# Patient Record
Sex: Male | Born: 1935 | Race: White | Hispanic: No | Marital: Married | Smoking: Never smoker
Health system: Southern US, Community
[De-identification: ages and names within clinical notes are randomized; demographics above are authoritative.]

## PROBLEM LIST (undated history)

## (undated) ENCOUNTER — Emergency Department (HOSPITAL_COMMUNITY): Payer: Medicare Other

## (undated) DIAGNOSIS — I739 Peripheral vascular disease, unspecified: Secondary | ICD-10-CM

## (undated) DIAGNOSIS — E785 Hyperlipidemia, unspecified: Secondary | ICD-10-CM

## (undated) DIAGNOSIS — I779 Disorder of arteries and arterioles, unspecified: Secondary | ICD-10-CM

## (undated) DIAGNOSIS — I723 Aneurysm of iliac artery: Secondary | ICD-10-CM

## (undated) DIAGNOSIS — R55 Syncope and collapse: Secondary | ICD-10-CM

## (undated) DIAGNOSIS — Z951 Presence of aortocoronary bypass graft: Secondary | ICD-10-CM

## (undated) DIAGNOSIS — M549 Dorsalgia, unspecified: Secondary | ICD-10-CM

## (undated) DIAGNOSIS — G629 Polyneuropathy, unspecified: Secondary | ICD-10-CM

## (undated) DIAGNOSIS — G8929 Other chronic pain: Secondary | ICD-10-CM

## (undated) DIAGNOSIS — J189 Pneumonia, unspecified organism: Secondary | ICD-10-CM

## (undated) DIAGNOSIS — I2510 Atherosclerotic heart disease of native coronary artery without angina pectoris: Secondary | ICD-10-CM

## (undated) DIAGNOSIS — C801 Malignant (primary) neoplasm, unspecified: Secondary | ICD-10-CM

## (undated) DIAGNOSIS — C3490 Malignant neoplasm of unspecified part of unspecified bronchus or lung: Secondary | ICD-10-CM

## (undated) DIAGNOSIS — I10 Essential (primary) hypertension: Secondary | ICD-10-CM

## (undated) DIAGNOSIS — I4891 Unspecified atrial fibrillation: Principal | ICD-10-CM

## (undated) DIAGNOSIS — C140 Malignant neoplasm of pharynx, unspecified: Secondary | ICD-10-CM

## (undated) HISTORY — DX: Presence of aortocoronary bypass graft: Z95.1

## (undated) HISTORY — PX: CERVICAL DISCECTOMY: SHX98

## (undated) HISTORY — DX: Dorsalgia, unspecified: M54.9

## (undated) HISTORY — DX: Malignant neoplasm of pharynx, unspecified: C14.0

## (undated) HISTORY — PX: CAROTID ENDARTERECTOMY: SUR193

## (undated) HISTORY — DX: Peripheral vascular disease, unspecified: I73.9

## (undated) HISTORY — DX: Atherosclerotic heart disease of native coronary artery without angina pectoris: I25.10

## (undated) HISTORY — DX: Hyperlipidemia, unspecified: E78.5

## (undated) HISTORY — DX: Aneurysm of iliac artery: I72.3

## (undated) HISTORY — DX: Other chronic pain: G89.29

## (undated) HISTORY — DX: Pneumonia, unspecified organism: J18.9

## (undated) HISTORY — DX: Syncope and collapse: R55

## (undated) HISTORY — DX: Disorder of arteries and arterioles, unspecified: I77.9

## (undated) HISTORY — DX: Essential (primary) hypertension: I10

## (undated) HISTORY — PX: LUMBAR DISC SURGERY: SHX700

## (undated) HISTORY — DX: Polyneuropathy, unspecified: G62.9

## (undated) HISTORY — DX: Unspecified atrial fibrillation: I48.91

## (undated) HISTORY — DX: Malignant neoplasm of unspecified part of unspecified bronchus or lung: C34.90

## (undated) HISTORY — DX: Malignant (primary) neoplasm, unspecified: C80.1

---

## 1995-12-07 DIAGNOSIS — Z951 Presence of aortocoronary bypass graft: Secondary | ICD-10-CM

## 1995-12-07 HISTORY — DX: Presence of aortocoronary bypass graft: Z95.1

## 1995-12-07 HISTORY — PX: CORONARY ARTERY BYPASS GRAFT: SHX141

## 2000-09-08 ENCOUNTER — Ambulatory Visit (HOSPITAL_COMMUNITY): Admission: RE | Admit: 2000-09-08 | Discharge: 2000-09-08 | Payer: Self-pay | Admitting: Cardiovascular Disease

## 2000-09-08 ENCOUNTER — Encounter: Payer: Self-pay | Admitting: Cardiovascular Disease

## 2001-03-31 ENCOUNTER — Encounter: Payer: Self-pay | Admitting: Cardiovascular Disease

## 2001-03-31 ENCOUNTER — Ambulatory Visit (HOSPITAL_COMMUNITY): Admission: RE | Admit: 2001-03-31 | Discharge: 2001-03-31 | Payer: Self-pay | Admitting: Cardiovascular Disease

## 2001-05-19 ENCOUNTER — Encounter (INDEPENDENT_AMBULATORY_CARE_PROVIDER_SITE_OTHER): Payer: Self-pay | Admitting: *Deleted

## 2001-05-19 ENCOUNTER — Inpatient Hospital Stay (HOSPITAL_COMMUNITY): Admission: RE | Admit: 2001-05-19 | Discharge: 2001-05-20 | Payer: Self-pay

## 2001-07-24 ENCOUNTER — Other Ambulatory Visit: Admission: RE | Admit: 2001-07-24 | Discharge: 2001-07-24 | Payer: Self-pay | Admitting: Urology

## 2001-07-24 ENCOUNTER — Encounter (INDEPENDENT_AMBULATORY_CARE_PROVIDER_SITE_OTHER): Payer: Self-pay | Admitting: *Deleted

## 2002-04-30 ENCOUNTER — Encounter: Payer: Self-pay | Admitting: Otolaryngology

## 2002-04-30 ENCOUNTER — Encounter: Admission: RE | Admit: 2002-04-30 | Discharge: 2002-04-30 | Payer: Self-pay | Admitting: Otolaryngology

## 2002-05-01 ENCOUNTER — Ambulatory Visit (HOSPITAL_BASED_OUTPATIENT_CLINIC_OR_DEPARTMENT_OTHER): Admission: RE | Admit: 2002-05-01 | Discharge: 2002-05-01 | Payer: Self-pay | Admitting: Otolaryngology

## 2002-05-01 ENCOUNTER — Encounter (INDEPENDENT_AMBULATORY_CARE_PROVIDER_SITE_OTHER): Payer: Self-pay | Admitting: *Deleted

## 2002-05-07 ENCOUNTER — Ambulatory Visit: Admission: RE | Admit: 2002-05-07 | Discharge: 2002-08-05 | Payer: Self-pay | Admitting: Radiation Oncology

## 2002-12-27 ENCOUNTER — Ambulatory Visit: Admission: RE | Admit: 2002-12-27 | Discharge: 2002-12-27 | Payer: Self-pay | Admitting: Radiation Oncology

## 2003-09-26 ENCOUNTER — Ambulatory Visit: Admission: RE | Admit: 2003-09-26 | Discharge: 2003-09-26 | Payer: Self-pay | Admitting: Radiation Oncology

## 2003-11-25 ENCOUNTER — Ambulatory Visit (HOSPITAL_BASED_OUTPATIENT_CLINIC_OR_DEPARTMENT_OTHER): Admission: RE | Admit: 2003-11-25 | Discharge: 2003-11-25 | Payer: Self-pay | Admitting: Otolaryngology

## 2003-11-25 ENCOUNTER — Ambulatory Visit (HOSPITAL_COMMUNITY): Admission: RE | Admit: 2003-11-25 | Discharge: 2003-11-25 | Payer: Self-pay | Admitting: Otolaryngology

## 2003-11-25 ENCOUNTER — Encounter (INDEPENDENT_AMBULATORY_CARE_PROVIDER_SITE_OTHER): Payer: Self-pay | Admitting: *Deleted

## 2003-11-25 ENCOUNTER — Encounter: Admission: RE | Admit: 2003-11-25 | Discharge: 2003-11-25 | Payer: Self-pay | Admitting: Otolaryngology

## 2004-01-15 ENCOUNTER — Encounter: Admission: RE | Admit: 2004-01-15 | Discharge: 2004-01-15 | Payer: Self-pay | Admitting: Otolaryngology

## 2004-03-11 ENCOUNTER — Ambulatory Visit: Admission: RE | Admit: 2004-03-11 | Discharge: 2004-03-11 | Payer: Self-pay | Admitting: Radiation Oncology

## 2004-06-29 ENCOUNTER — Encounter: Admission: RE | Admit: 2004-06-29 | Discharge: 2004-06-29 | Payer: Self-pay | Admitting: Otolaryngology

## 2004-06-30 ENCOUNTER — Ambulatory Visit (HOSPITAL_BASED_OUTPATIENT_CLINIC_OR_DEPARTMENT_OTHER): Admission: RE | Admit: 2004-06-30 | Discharge: 2004-06-30 | Payer: Self-pay | Admitting: Otolaryngology

## 2004-06-30 ENCOUNTER — Ambulatory Visit (HOSPITAL_COMMUNITY): Admission: RE | Admit: 2004-06-30 | Discharge: 2004-06-30 | Payer: Self-pay | Admitting: Otolaryngology

## 2004-08-03 ENCOUNTER — Observation Stay (HOSPITAL_COMMUNITY): Admission: EM | Admit: 2004-08-03 | Discharge: 2004-08-04 | Payer: Self-pay | Admitting: Emergency Medicine

## 2004-08-12 ENCOUNTER — Ambulatory Visit: Admission: RE | Admit: 2004-08-12 | Discharge: 2004-08-12 | Payer: Self-pay | Admitting: Radiation Oncology

## 2004-11-30 ENCOUNTER — Emergency Department (HOSPITAL_COMMUNITY): Admission: EM | Admit: 2004-11-30 | Discharge: 2004-11-30 | Payer: Self-pay | Admitting: Emergency Medicine

## 2005-08-17 ENCOUNTER — Ambulatory Visit: Payer: Self-pay | Admitting: Gastroenterology

## 2005-11-09 ENCOUNTER — Ambulatory Visit (HOSPITAL_COMMUNITY): Admission: RE | Admit: 2005-11-09 | Discharge: 2005-11-09 | Payer: Self-pay | Admitting: Cardiovascular Disease

## 2006-02-15 ENCOUNTER — Emergency Department: Payer: Self-pay | Admitting: Emergency Medicine

## 2006-02-15 ENCOUNTER — Other Ambulatory Visit: Payer: Self-pay

## 2006-06-23 ENCOUNTER — Inpatient Hospital Stay (HOSPITAL_COMMUNITY): Admission: EM | Admit: 2006-06-23 | Discharge: 2006-06-28 | Payer: Self-pay | Admitting: Family Medicine

## 2006-06-24 ENCOUNTER — Encounter (INDEPENDENT_AMBULATORY_CARE_PROVIDER_SITE_OTHER): Payer: Self-pay | Admitting: *Deleted

## 2006-06-24 ENCOUNTER — Ambulatory Visit: Payer: Self-pay | Admitting: Internal Medicine

## 2008-02-21 ENCOUNTER — Encounter: Payer: Self-pay | Admitting: Internal Medicine

## 2008-03-06 ENCOUNTER — Encounter: Payer: Self-pay | Admitting: Internal Medicine

## 2008-08-14 ENCOUNTER — Ambulatory Visit: Payer: Self-pay | Admitting: Unknown Physician Specialty

## 2008-08-27 ENCOUNTER — Encounter: Payer: Self-pay | Admitting: Specialist

## 2008-09-05 ENCOUNTER — Encounter: Payer: Self-pay | Admitting: Specialist

## 2009-03-11 ENCOUNTER — Ambulatory Visit (HOSPITAL_BASED_OUTPATIENT_CLINIC_OR_DEPARTMENT_OTHER): Admission: RE | Admit: 2009-03-11 | Discharge: 2009-03-11 | Payer: Self-pay | Admitting: Otolaryngology

## 2009-03-11 ENCOUNTER — Encounter (INDEPENDENT_AMBULATORY_CARE_PROVIDER_SITE_OTHER): Payer: Self-pay | Admitting: Otolaryngology

## 2009-05-05 ENCOUNTER — Emergency Department (HOSPITAL_COMMUNITY): Admission: AC | Admit: 2009-05-05 | Discharge: 2009-05-05 | Payer: Self-pay

## 2009-09-01 ENCOUNTER — Ambulatory Visit: Payer: Self-pay | Admitting: Family Medicine

## 2009-11-17 ENCOUNTER — Emergency Department (HOSPITAL_COMMUNITY): Admission: EM | Admit: 2009-11-17 | Discharge: 2009-11-17 | Payer: Self-pay | Admitting: Emergency Medicine

## 2010-04-08 ENCOUNTER — Encounter: Admission: RE | Admit: 2010-04-08 | Discharge: 2010-04-08 | Payer: Self-pay | Admitting: Otolaryngology

## 2010-04-10 ENCOUNTER — Ambulatory Visit (HOSPITAL_BASED_OUTPATIENT_CLINIC_OR_DEPARTMENT_OTHER): Admission: RE | Admit: 2010-04-10 | Discharge: 2010-04-10 | Payer: Self-pay | Admitting: Otolaryngology

## 2010-10-06 ENCOUNTER — Ambulatory Visit (HOSPITAL_BASED_OUTPATIENT_CLINIC_OR_DEPARTMENT_OTHER): Admission: RE | Admit: 2010-10-06 | Discharge: 2010-10-06 | Payer: Self-pay | Admitting: Otolaryngology

## 2010-12-11 ENCOUNTER — Encounter
Admission: RE | Admit: 2010-12-11 | Discharge: 2010-12-11 | Payer: Self-pay | Source: Home / Self Care | Attending: Cardiovascular Disease | Admitting: Cardiovascular Disease

## 2010-12-24 ENCOUNTER — Ambulatory Visit (HOSPITAL_COMMUNITY)
Admission: RE | Admit: 2010-12-24 | Discharge: 2010-12-25 | Payer: Self-pay | Source: Home / Self Care | Attending: Ophthalmology | Admitting: Ophthalmology

## 2010-12-28 LAB — BASIC METABOLIC PANEL
BUN: 9 mg/dL (ref 6–23)
GFR calc non Af Amer: >60 mL/min (ref >60)
Glucose, Bld: 86 mg/dL (ref 70–99)
Potassium: 4.1 mEq/L (ref 3.5–5.1)

## 2010-12-28 LAB — CBC
HCT: 37.5 % — ABNORMAL LOW (ref 39.0–52.0)
MCHC: 32.8 g/dL (ref 30.0–36.0)
MCV: 91.2 fL (ref 78.0–100.0)
RDW: 13.1 % (ref 11.5–15.5)

## 2010-12-28 LAB — CARDIAC PANEL(CRET KIN+CKTOT+MB+TROPI)
CK, MB: 1.8 ng/mL (ref 0.3–4.0)
Relative Index: INVALID (ref 0.0–2.5)
Relative Index: INVALID (ref 0.0–2.5)
Troponin I: 0.02 ng/mL (ref 0.00–0.06)

## 2010-12-31 NOTE — Consult Note (Addendum)
Ward, Jon              ACCOUNT NO.:  [id number]  MEDICAL RECORD NO.:  [id number]          PATIENT TYPE:  OIB  LOCATION:  2038                         FACILITY:  [facility]  PHYSICIAN:  Thereasa Solo. Little, M.D. DATE OF BIRTH:  1929-09-04  DATE OF CONSULTATION:  12/24/2010 DATE OF DISCHARGE:                                CONSULTATION   PRIMARY CARE PHYSICIAN:  Julieanne Manson in [place].  REASON FOR CONSULTATION:  Tachycardia, rates up to 112 in the operating room and PACU.  HISTORY OF PRESENT ILLNESS:  A 77 year old male patient of Dr. Clayborne Dana with history of bypass grafting in 1997 x3 as well as peripheral vascular disease with history of left carotid endarterectomy and iliac stenting in the past.  The patient presented to the operating room today for PPV surgery on his eye with Dr. Ashley Royalty.  I was originally told by Anesthesia, he did have some PACs and brief episodes of tachycardia in the surgery and then he came to the PACU, was bradycardic and then had PACs and some slight SVT with a rate of 112.  On this, it looks to be multifocal atrial tach, self-terminated and short lived, but multiple short events.  At the time of evaluation, he was sinus brady of 56, blood pressure of 130/45.  FAMILY HISTORY:  Not significant to this.  SOCIAL HISTORY:  He is married.  No children.  Does not use tobacco.  ALLERGIES:  To PENICILLIN.  OTHER PAST MEDICAL HISTORY:  Recent abnormal lower extremity arterial Dopplers with plans for PV angiogram once his eyes surgery was resolved.  OTHER SURGERIES:  Palatal lesion consistent with carcinoma in situ and excision of palatal lesion in November 2011 and history of anterior vocal cord carcinoma with chemo in 1990 and wide excision in 2004.  The patient also has a history of left subclavian artery stenosis.  He has had lumbar diskectomy in 1996 and again in 76, treated hypertension.  HOME MEDICATIONS: 1. Finasteride 5 mg  one daily. 2. Benazepril 10 mg one daily. 3. Gabapentin 100 mg 2 capsules q.i.d. 4. Niacin SR 500 mg one daily. 5. Valium 5 mg 1 t.i.d. p.r.n. 6. Soma 350 mg t.i.d. p.r.n. 7. Lovastatin 40 mg 1 tablet q.p.m. 8. Multivitamins 1 daily. 9. Tramadol 50 mg 1 tablet q.6 h p.r.n. 10.Pletal 50 mg 1 daily. 11.He is not on any beta blockers.  REVIEW OF SYSTEMS:  Denies any chest pain.  PULMONARY:  Gets occasional shortness of breath.  GI:  No diarrhea, constipation, or melena.  NEURO: No lightheadedness, dizziness.  No syncope.  MUSCULOSKELETAL:  Negative.  PHYSICAL EXAMINATION:  VITAL SIGNS:  Blood pressure 138/73, pulse 76, respiratory rate is 18, temperature 98, oxygen saturation room air 99%. GENERAL:  Alert, oriented white male, right eye patch in place. HEENT:  Normocephalic. LUNGS:  Clear without rales, rhonchi, or wheezes. HEART:  Regular rate and rhythm without obvious murmur. ABDOMEN:  Soft, nontender.  Positive bowel sounds. LOWER EXTREMITIES:  Without edema.  Pedal pulses weak. NEUROLOGIC:  Alert and oriented x3.EKG:  Sinus rhythm to sinus bradycardia, no EKG changes, frequent PACs on some the EKGs  IMPRESSION: 1. Supraventricular tachycardia, possible multifocal atrial     tachycardia, the heart rate never greater than 112. 2. Premature atrial contractions. 3. Sinus bradycardia. 4. Pars plana vitrectomy surgery. 5. Coronary disease with history of bypass grafting in 1997. 6. Peripheral vascular disease with need for pulmonary vein angiogram     of his lower extremities in the future.  PLAN:  Cardiac enzymes; CK 67, MB 1.8, troponin-I 0.01.  We have given the patient an extra 250 bolus of normal saline here in the recovery room.  Electrolytes:  Sodium 139, potassium 4.1, chloride 106, CO2 27, glucose 86, BUN 9, creatinine 0.86, and calcium 8.5.  Hemoglobin 12.3, hematocrit 37.5, WBC 11, and platelets 171.  Chest x-ray on January 6, COPD, stable cardiomegaly.  No active  lung disease.  Dr. Clarene Duke also discussed this with the patient's wife.  We will follow up during hospitalization.  He will be placed in a telemetry bed.  His last stress test was July 2010, which revealed diaphragmatic attenuation with small area of inferior scar without ischemia.  He also had a 2-D echo, EF was 65%.     [name], N.P.   ______________________________ Thereasa Solo Little, M.D.    LRI/MEDQ  D:  12/24/2010  T:  12/25/2010  Job:  [id number]  cc:   Nanetta Batty, M.D.  Electronically Signed by Nada Boozer N.P. on 12/25/2010 07:10:07 PM Electronically Signed by Julieanne Manson M.D. on 12/31/2010 04:01:29 PM

## 2011-01-02 NOTE — Op Note (Signed)
  NAMEBOYD, BUFFALO              ACCOUNT NO.:  [id number]  MEDICAL RECORD NO.:  [id number]          PATIENT TYPE:  OIB  LOCATION:  2038                         FACILITY:  [facility]  PHYSICIAN:  Beulah Gandy. Ashley Royalty, M.D. DATE OF BIRTH:  December 12, 1928  DATE OF PROCEDURE:  12/24/2010 DATE OF DISCHARGE:                              OPERATIVE REPORT   ADMISSION DIAGNOSES:  Preretinal fibrosis, right eye.  PROCEDURES:  Pars plana vitrectomy, retinal photocoagulation, membrane peel and gas fluid exchange in the right eye.  SURGEON:  Beulah Gandy. Ashley Royalty, MD  ASSISTANT:  Rosalie Doctor, MA  ANESTHESIA:  General.  DETAILS:  Usual prep and drape, a 25-gauge trocar was placed at 8 and 10 o'clock.  A 20-gauge opening was made at 2 o'clock in the pars plana with a three-layered wound.  Contact lens ring anchored into place at 6 and 12 o'clock.  Provisc or Goniosol placed on the cornea and the flat contact lens was placed.  Pars plana vitrectomy was begun just behind the pseudophakos.  Dense membranes were encountered.  There were membranes attached to the disk.  They were peeled from the disk and peeled across the macular region with the vitreous cutter.  Once these areas were elevated, there was a tight adherent silver membrane on the macula as well.  This was engaged with the 20-gauge MVR instrument and then teased across the macular region with the silicone tip suction line, diamond dusted membrane scraper and 20-gauge MVR.  The membrane peeled all the way across the macula and over to the disk.  It was removed with the vitrectomy cutter.  The vitrectomy was carried out into the far periphery where additional membranes were encountered and removed.  The indirect ophthalmoscope laser was moved into place, 725 burns placed around the retinal periphery with the power of 200 milliwatts, 1000 microns each and 0.1 seconds.  Each treatments were made around weak areas in the retina.  A 30% gas fluid  exchange was performed.  The instruments were removed from the eye and the trocars were removed.  A 9-0 nylon was used to close the sclerotomy site at 2 o'clock.  Wet-field cautery, closure of the conjunctiva.  Polymyxin and gentamicin were irrigated into [place] space.  Atropine solution was applied, Decadron 10 mg was injected into the lower subconjunctival space.  Marcaine was injected around the globe for postop pain.  Closing pressure was 10 with a Barraquer tonometer.  Complications none. Duration 1 hour.  The patient was awakened and taken to recovery in satisfactory condition.     Beulah Gandy. Ashley Royalty, M.D.    JDM/MEDQ  D:  12/24/2010  T:  12/25/2010  Job:  [id number]  Electronically Signed by Alan Mulder M.D. on 01/02/2011 09:26:17 AM

## 2011-02-16 LAB — POCT HEMOGLOBIN-HEMACUE: Hemoglobin: 13 g/dL (ref 13.0–17.0)

## 2011-02-17 LAB — BASIC METABOLIC PANEL
BUN: 8 mg/dL (ref 6–23)
CO2: 27 mEq/L (ref 19–32)
Chloride: 107 mEq/L (ref 96–112)
Creatinine, Ser: 0.68 mg/dL (ref 0.4–1.5)
Glucose, Bld: 119 mg/dL — ABNORMAL HIGH (ref 70–99)

## 2011-02-23 LAB — BASIC METABOLIC PANEL
BUN: 8 mg/dL (ref 6–23)
CO2: 28 mEq/L (ref 19–32)
Calcium: 9.1 mg/dL (ref 8.4–10.5)
Creatinine, Ser: 0.69 mg/dL (ref 0.4–1.5)
GFR calc Af Amer: >60 mL/min (ref >60)
Glucose, Bld: 93 mg/dL (ref 70–99)

## 2011-02-23 LAB — POCT HEMOGLOBIN-HEMACUE: Hemoglobin: 12.9 g/dL — ABNORMAL LOW (ref 13.0–17.0)

## 2011-03-09 LAB — BASIC METABOLIC PANEL
BUN: 10 mg/dL (ref 6–23)
Calcium: 8.5 mg/dL (ref 8.4–10.5)
Creatinine, Ser: 0.71 mg/dL (ref 0.4–1.5)
GFR calc non Af Amer: >60 mL/min (ref >60)
Glucose, Bld: 86 mg/dL (ref 70–99)
Potassium: 4.4 mEq/L (ref 3.5–5.1)

## 2011-03-09 LAB — CBC
HCT: 36.8 % — ABNORMAL LOW (ref 39.0–52.0)
Platelets: 174 10*3/uL (ref 150–400)
RDW: 13.6 % (ref 11.5–15.5)
WBC: 10.1 10*3/uL (ref 4.0–10.5)

## 2011-03-09 LAB — DIFFERENTIAL
Basophils Absolute: 0 10*3/uL (ref 0.0–0.1)
Eosinophils Relative: 2 % (ref 0–5)
Lymphocytes Relative: 20 % (ref 12–46)
Neutrophils Relative %: 68 % (ref 43–77)

## 2011-03-16 LAB — CBC
HCT: 34.8 % — ABNORMAL LOW (ref 39.0–52.0)
Hemoglobin: 12.1 g/dL — ABNORMAL LOW (ref 13.0–17.0)
MCHC: 34.7 g/dL (ref 30.0–36.0)
MCV: 94.3 fL (ref 78.0–100.0)
Platelets: 174 K/uL (ref 150–400)
RBC: 3.69 MIL/uL — ABNORMAL LOW (ref 4.22–5.81)
RDW: 12.9 % (ref 11.5–15.5)
WBC: 8.4 K/uL (ref 4.0–10.5)

## 2011-03-16 LAB — BASIC METABOLIC PANEL WITH GFR
BUN: 14 mg/dL (ref 6–23)
CO2: 24 meq/L (ref 19–32)
Calcium: 8.2 mg/dL — ABNORMAL LOW (ref 8.4–10.5)
Chloride: 107 meq/L (ref 96–112)
Creatinine, Ser: 0.85 mg/dL (ref 0.4–1.5)
GFR calc non Af Amer: >60 mL/min
Glucose, Bld: 133 mg/dL — ABNORMAL HIGH (ref 70–99)
Potassium: 3.8 meq/L (ref 3.5–5.1)
Sodium: 137 meq/L (ref 135–145)

## 2011-03-16 LAB — TYPE AND SCREEN
ABO/RH(D): A POS
Antibody Screen: NEGATIVE

## 2011-03-16 LAB — ABO/RH: ABO/RH(D): A POS

## 2011-03-16 LAB — LACTIC ACID, PLASMA: Lactic Acid, Venous: 1.5 mmol/L (ref 0.5–2.2)

## 2011-03-16 LAB — APTT: aPTT: 31 s (ref 24–37)

## 2011-03-16 LAB — PROTIME-INR
INR: 1.2 (ref 0.00–1.49)
Prothrombin Time: 15.1 s (ref 11.6–15.2)

## 2011-04-20 NOTE — Op Note (Signed)
NAMEJALAN, [name]              ACCOUNT NO.:  [id number]   MEDICAL RECORD NO.:  [id number]          PATIENT TYPE:  AMB   LOCATION:  DSC                          FACILITY:  [facility]   PHYSICIAN:  Christopher E. 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   OPERATION:  CO2 laser excision of the uvula and the laser ablation of  soft palate and mucosa with CO2 laser.   SURGEON:  Kristine Garbe. Ezzard Standing, MD   ANESTHESIA:  Local, Xylocaine with epinephrine along with Cetacaine  topical spray.   COMPLICATIONS:  None.   CLINICAL NOTE:  [name] is a 77 year old gentleman who has had  previous history of hypopharyngeal as well as laryngeal cancer.  He is  status post treatment with radiation therapy.  Over the last 3 months,  he has had some significant erythroplasia and leukoplakia involving the  uvula as well as mucosa on the oral side of the soft palate.  This has  not responded to antibiotic or oral therapy.  Findings are consistent  with premalignant or malignant type changes.  He is taken to the  operating room at this time for excision of the uvula and CO2 laser  ablation of the erythroplasia on the soft palate.   DESCRIPTION OF PROCEDURE:  The patient was brought to the operating  room.  He had a topical Cetacaine spray for topical anesthetic, and the  uvula and soft palate were injected with 2.5 mL of Xylocaine with  epinephrine for local anesthetic.  Using the CO2 laser, set at 8 on  continuous, the uvula was excised and sent to pathology.  There was some  additional erythroplasia of the soft palate mucosa more superiorly and  this was ablated with defocused CO2 laser.  This completed the  procedure.  There  was minimal bleeding.  And the patient was  subsequently discharged home on diet as tolerated.  He is given Tylenol  and Vicodin p.r.n. pain and Z-Pak for 5 days. We will have him follow up  in my office in 10-14 days to review pathology.           ______________________________  Kristine Garbe Ezzard Standing, M.D.     CEN/MEDQ  D:  03/11/2009  T:  03/12/2009  Job:  [id number]

## 2011-04-23 NOTE — Consult Note (Signed)
[name], [name]              ACCOUNT NO.:  [id number]   MEDICAL RECORD NO.:  [id number]          PATIENT TYPE:  INP   LOCATION:  3713                         FACILITY:  [facility]   PHYSICIAN:  Cristi Loron, M.D.DATE OF BIRTH:  [birth year]   DATE OF CONSULTATION:  06/24/2006  DATE OF DISCHARGE:                                   CONSULTATION   CHIEF COMPLAINT:  Near syncope.   HISTORY OF PRESENT ILLNESS:  The patient is a 77 year old white male who  presents with a jumbled and complicated history.  He states that his most  recent problems began about two months ago when he had the flu.  He was  seen at [facility] where he was treated and released.  At that point he thought I was going to die.  Eventually these symptoms  resolved.   More recently, the patient states he has been feeling bad.  It is  difficult to pin him down on what exactly is bothering him.  It sounds like  he has had a couple of near syncopal episodes.  A couple of days ago he was  at the grocery store and he felt as if he was going to pass out.  He felt  weak in general and had to be helped to his seat.  He is brought to his  primary doctor, Dr. Julieanne Manson, who, according to the patient did some  blood tests and he was then sent for GI doctor for, from what I gather, a  possible GI bleed.  They adjusted his medications some.  More recently as  above, the patient had his near syncopal episode and he was seen by urgent  care and then was referred from Urgent Care to [facility] Emergency  Department where he was admitted by the teaching service.  Presently the  patient has multiple diffuse complaints but the main seems to be these near  syncopal episodes.  His fingers are numb.  His hands feel weak.  His feet  are numb.  He feels his fingers are getting progressively more numb over the  years.  The patient denies sudden onset headaches.  Did not notice any  palpitations with some  questionable history of chest pain.   The patient was submitted by teaching service and workup has included a  brain MRI, MRA as well as cervical and lumbar MRI when the spine MRIs were  abnormal and subsequent consultation was requested.   PAST MEDICAL HISTORY:  Positive for coronary artery disease, vocal cord  cancer diagnosed x2 in 1999 and 2004, colon polyps, peripheral  cerebrovascular disease, hyperlipidemia, benign prostatic hypertrophy with  elevated PSA, hypertension, cataracts, peptic ulcer disease, cervical  ruptured disk, lumbar ruptured disk, ulna neuropathy, hypercholesterolemia,  peripheral neuropathy, chronic lumbago.   PAST SURGICAL HISTORY:  Status post lumbar discectomy in 1966 and 1974 done  in [place], anterior cervical discectomy and fusion 1977 in [place],  ulnar nerve transposition, coronary artery bypass graft x3, left carotid  endarterectomy, right iliac artery angioplasty stenting, resection section  common iliac artery, cataract surgery, removal of vocal  cord cancer with  subsequent radiation.   PRESENT MEDICATIONS:  1.  Lotensin 10 mg p.o. daily.  2.  Cardura 4 mg p.o. daily.  3.  Zocor 20 mg daily.  4.  Plendil 50 mg p.o. b.i.d.  5.  Neurontin 200 mg p.o. b.i.d.  6.  Prozac 20 mg p.o. daily.  7.  Valium 5 mg p.r.n.  8.  Soma p.r.n.  9.  Lovenox 40 mg subcutaneous daily.  10. Proscar 5 mg p.o. daily.  11. Protonix 40 mg p.o. daily.  12. Phenergan p.r.n.  13. Dulcolax p.r.n.  14. Darvocet p.r.n.  15. Tylenol p.r.n.   DRUG ALLERGIES:  PENICILLIN CAUSES ANAPHYLACTIC REACTION.   FAMILY MEDICAL HISTORY:  Noncontributory.   SOCIAL HISTORY:  The patient is on his second marriage.  His first wife  died.  He is a retired Art therapist.  He has been disabled since a  Workmen's Comp injury in 1974.  He was smoking prior to that he had a three  pack per day 60 year history.  He quit drinking alcohol in 1997, heavy  drinking history, by  report.  Denies other drug use.   REVIEW OF SYSTEMS:  Negative except as above.   PHYSICAL EXAMINATION:  GENERAL:  A pleasant 77 year old white male in no  apparent distress.  HEENT:  Normocephalic, atraumatic.  Pupils are equal and round, react to  light.  Extraocular muscles are intact.  Oropharynx benign.  NECK:  Supple without masses, deformities, tracheal deviation, jugular  venous distention.  He has a limited cervical range of motion.  Spurling  testing is negative bilaterally, llhermittes was not present.  The patient  has a well-healed left carotid endarterectomy incision.  I really cannot  make out his anterior cervical discectomy incision.  Thorax is symmetric.  LUNGS:  Clear to auscultation.  HEART:  Regular rate and rhythm.  ABDOMEN:  Soft, nontender.  EXTREMITIES:  The patient has bilateral foot drops.  He has a well-healed  right ulnar nerve incision.  BACK:  The patient has a well-healed lumbar incision.  No deformities, point  tenderness.  Testing is negative bilaterally.  NEUROLOGIC EXAM:  The patient is alert and oriented x3.  Cranial nerves II  through XII were examined bilaterally.  Vision and hearing grossly normal  bilaterally except for some presbyopia and presbycusis which are age  appropriate.  Motor strength is difficult to assess as, at times, the  patient gives away but appears to be grossly normal his deltoid, biceps,  triceps, psoas, quadriceps.  Patient has weakness in his right hand grip at  approximately 4+/5.  The patient has bilateral foot drops (i.e., zero to  five).  His gastrocnemius strength is 4-/5 bilaterally.  The patient has a  strange tremor upon muscle strength testing, possibly non physiologic.  The  patient's deep tendon reflexes are 2/4.  His bilateral biceps, triceps 2+ to  3/4 in bilateral quadriceps and right gastrocnemius.  His left gastrocnemius reflex is 0/5.  The patient has three to four beat non-sustained ankle  clonus on the  right.  He has equivocal responses bilaterally.  Cerebellar  function is intact to rapid alternating movements and finger-to-nose  bilaterally.   I have reviewed the patient's cervical MRI performed without contrast June 23, 2006 at [facility].  Demonstrates the patient has a good  decompression effusion at C5/6 without residual stenosis.  The patient has  multifactorial spinal stenosis at C4/5 and, to a lesser extent,  C6/7.  On  C3/4 has some mild spinal stenosis and moderate bilateral neural foraminal  stenosis.  C4/5 has severe right neuro foraminal stenosis and mild left  neuro foraminal stenosis and moderate central spinal stenosis.  As above  C5/6 looks good.  C6/7 has a moderate central spondylosis versus herniated  disk and some relatively mild spinal stenosis.  C7/T1 looks fairly okay.   The patient's lumbar MRI performed with and without contrast June 23, 2006.  The patient had an L5 laminectomy.  He has degenerative disk disease at  L3/4, L4/5, and L5/S1 with Modic-type changes L3/4.  On axial film of L1/2  has some mild multifactorial spinal stenosis largely secondary to a  ligamentum flavum hypertrophy.  L3/4 and L4/5 have right greater than left  lateral recess stenosis secondary to some ligamentum flavum facet  hypertrophy.  L4/5 has a bilateral recess stenosis.  L5/S1 looks fairly  okay, being status post a laminectomy at that level.   Also patient's brain MRI.  MRI performed at [facility] June 23, 2006.  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  The MRI demonstrates he had a left  carotid endarterectomy.  The patient has bilateral vertebral artery stenosis  and right subclavian stenosis.  His anterior cranial vessels look fairly  okay.   ASSESSMENT AND PLAN:  1.  Near-syncopal episodes.  I have discussed this with the patient and his      wife.  A possible cause of this could be vertebral  artery insufficiency      although the patient has multiple medical problems to consider as well.      I think perhaps a neurology consult will help [place] with this.  I leave      this up to the medical team.  2.  Cervical stenosis.  I discussed this with him as well.  I told him he      has a moderate amount of stenosis above where he had his prior surgery      (i.e., at C4/5).  I explained that this could cause some numbness in his      hands and weakness (i.e., central cord syndrome).  However, this does      not seem to be an acute or pressing problem and given all of his medical      problems, I think there is no need to do surgery any time soon.  Would      recommend that he follow up with me in the office and we can discuss      this further at that time.  3.  Lumbar degenerative disk disease and stenosis.  This is a chronic      problem and I do not think he will need surgery on this. 4.  Multiple medical problems noted.      Cristi Loron, M.D.  Electronically Signed     JDJ/MEDQ  D:  06/24/2006  T:  06/24/2006  Job:  [id number]   cc:   Layne Benton, M.D.  [address] [place], [place] [address]   Julieanne Manson  Fax: [phone]

## 2011-04-23 NOTE — Op Note (Signed)
NAME:  [name], [name]                        ACCOUNT NO.:  [id number]   MEDICAL RECORD NO.:  [id number]                   PATIENT TYPE:  AMB   LOCATION:  DSC                                  FACILITY:  [facility]   PHYSICIAN:  Kristine Garbe. Ezzard Standing, M.D.         DATE OF BIRTH:  08/17/1929   DATE OF PROCEDURE:  11/25/2003  DATE OF DISCHARGE:                                 OPERATIVE REPORT   PREOPERATIVE DIAGNOSIS:  Hypopharyngeal lesion, questionable  neoplasia.   POSTOPERATIVE DIAGNOSIS:  Hypopharyngeal lesion, questionable neoplasia (1 x  1.5 cm exophytic mucosal lesion).   OPERATION:  Direct laryngoscopy, excisional biopsy of left posterior  hypopharyngeal lesion.   SURGEON:  Kristine Garbe. Ezzard Standing, M.D.   ANESTHESIA:  General endotracheal anesthesia.   COMPLICATIONS:  None.   INDICATIONS FOR PROCEDURE:  [name] is a 77 year old gentleman  who  has had a previous  history of  vocal cord cancer, status post  radiation  therapy. On routine follow up in the office the patient was noted to have a  hypopharyngeal lesion  on the posterior  pharyngeal wall which is new. He is  taken to the operating room at this time for direct laryngoscopy and biopsy.   DESCRIPTION OF PROCEDURE:  After adequate endotracheal anesthesia, first  direct laryngoscopy was performed. The tonsil regions appeared  benign  bilaterally. The base of tongue and vallecula area was clear. The epiglottis  appeared normal. On evaluation of the  vocal cords, the  vocal cords  appeared clear bilaterally. The anterior commissure was clear. Photos were  obtained.   On examination  of the hypopharynx, the patient had an elongated 1  x 1.5 cm  papillomatous appearing lesion on the left posterior hypopharyngeal wall  just above the left arytenoid. This was able to be easily visualized using  the Crowe-Davis mouth gag. With the lesion under direct visualization, it  was excised with scissors and  cautery and the  excision site was left open.   Hemostasis was achieved with adrenaline soaked pledgets and cautery. A  single stitch was used to mark the 1 o'clock position  on the excision site.  The lesion was grossly excised with 4 to 5-mm margins surrounding it. The  specimen was sent to pathology.  After obtaining adequate hemostasis, further direct laryngoscopy of the  piriform sinuses was clear bilaterally. The patient was awakened from  anesthesia and transferred to the recovery room postoperatively doing well.   DISPOSITION:  The patient is discharged to home later this morning on Keflex  500 b.i.d. for 1 week, Tylenol  and Tylenol #3 1 to  2 tablets q.4h. p.r.n.  pain. I will have him follow up in my office in 3 days for recheck.  Kristine Garbe. Ezzard Standing, M.D.    CEN/MEDQ  D:  11/25/2003  T:  11/25/2003  Job:  [id number]   cc:   Julieanne Manson  [address]  [place]  [place] [address]  Fax: [phone]   Maryln Gottron, M.D.  [address] [facility] [facility]  [place]  [place] [address]  Fax: [phone]

## 2011-04-23 NOTE — Cardiovascular Report (Signed)
NAMEARISTON, GRANDISON NO.:  [id number]   MEDICAL RECORD NO.:  [id number]          PATIENT TYPE:  INP   LOCATION:  3713                         FACILITY:  [facility]   PHYSICIAN:  Nanetta Batty, M.D.   DATE OF BIRTH:  02-14-1929   DATE OF PROCEDURE:  06/27/2006  DATE OF DISCHARGE:                              CARDIAC CATHETERIZATION   PROCEDURE:   HISTORY:  Venning is a 77 year old white male with a history of CABG x3 in  1997, a __________.  He has had left carotid endarterectomy and iliac  stenting in the past.  He was admitted 07/19 with dizziness.  An MRA showed  the possibility of right subclavian and bilateral vertebral disease with a  parietal infarct of unknown age.  He has had no further dizziness here.  He  presents now for subclavian and vertebral angiography to rule out posterior  circulation/subclavian steal physiology.   PROCEDURE DESCRIPTION:  The patient was brought to the second floor [place] of  PV angiographic suite in the postabsorptive state.  He was premedicated with  p.o. Valium. He was prepped and draped in the usual sterile fashion. Then 1%  Xylocaine was used for local anesthesia. A 5-French sheath was inserted into  the right femoral artery using standard Seldinger technique.  A 5-French  pigtail catheter was used for arch angiography.  A 5-French CV catheter was  used for selective right subclavian, right-and-left vertebral angiography.  Hand shot was performed via the __________  sheath to visualize the right  external and common iliac artery stents.  Visipaque dye was used for the  entirety of the case.  The aortic pressure were monitored throughout the  case.   ANGIOGRAPHIC RESULTS:  1.  Right subclavian widely patent.  2.  Right vertebral widely patent with an intact posterior circulation      anatomy.  3.  Left vertebral widely patent.  There is no evidence of ostial      involvement.  4.  Right common and external iliac artery  stents widely patent.   IMPRESSION:  Mr. Silvestro has widely patent subclavian vertebral arteries.  I  do not think that he has posterior circulation symptoms, or anatomy which  would contribute to this.  I believe that his MRA was false positive.  Sheath was removed; and pressure was placed on the groin for hemostasis.  The patient left the lab in stable condition.      Nanetta Batty, M.D.  Electronically Signed     JB/MEDQ  D:  06/27/2006  T:  06/27/2006  Job:  [id number]   cc:   Peripheral Vascular Lab   [facility]

## 2011-04-23 NOTE — Discharge Summary (Signed)
[name], [name]              ACCOUNT NO.:  [id number]   MEDICAL RECORD NO.:  [id number]          PATIENT TYPE:  AMB   LOCATION:  SDS                          FACILITY:  [facility]   PHYSICIAN:  Nanetta Batty, M.D.   DATE OF BIRTH:  Feb 06, 1929   DATE OF ADMISSION:  11/09/2005  DATE OF DISCHARGE:  11/09/2005                                 DISCHARGE SUMMARY   DISCHARGE DIAGNOSES:  1.  Peripheral vascular disease with failed attempt at peripheral angiogram      and peripheral intervention this admission.  2.  Known peripheral vascular disease with in-stent restenosis of the right      iliac stent.  3.  Coronary artery bypass grafting in 1997.  4.  Dyslipidemia.  5.  Treated hypertension.  6.  Chronic pain and disability secondary to nerve damage after back surgery      in the past.   HOSPITAL COURSE:  [name] is a 77 year old male followed by Dr. Allyson Sabal  with a prior history of coronary artery bypass grafting.  He also has  widespread peripheral vascular disease.  Peripheral Dopplers done as an  outpatient indicated in-stent restenosis to his previously placed right  iliac stent.  The patient was admitted for elective intervention.  He did  have a Cardiolite study in September 2006 that showed scar in the RCA  distribution but no ischemia.   PAST MEDICAL HISTORY:  As noted above.  He also has a history of laryngeal  cancer and had radiation therapy in 2003.   MEDICATIONS PRIOR TO ADMISSION:  1.  Prozac 20 mg a day.  2.  Valium 5 mg 3 times a day p.r.n.  3.  Darvocet p.r.n.  4.  Neurontin 200 mg 4 times a day.  5.  Soma 350 mg daily.  6.  Aspirin daily.  7.  Lovastatin 40 mg at bedtime.  8.  Niacin 500 mg 2 tablets at bedtime.  9.  Doxazosin 1 mg at bedtime.  10. Benazepril 10 mg a day.   ALLERGIES:  PENICILLIN.   SOCIAL HISTORY:  He is married.  [facility]   FAMILY HISTORY:  Unremarkable for coronary disease.   REVIEW OF  SYSTEMS:  Unremarkable except as noted above.  He has had remote  GI bleeding in the 1970s.  History of history of thyroid disease or prior  stroke.   PHYSICAL EXAMINATION:  VITAL SIGNS:  Blood pressure 128/68, pulse 83,  temperature 97.  GENERAL:  He is a well-developed, overweight male in no acute distress.  HEENT: Normocephalic.  Extraocular movements are intact.  Sclerae  nonicteric.  NECK:  Without JVD.  CHEST: Clear to auscultation and percussion.  CARDIAC:  Regular rate and rhythm with S1 and S2.  No murmur or gallop  noted.  ABDOMEN:  Tender.  Bowel sounds are present.  EXTREMITIES:  Reveal bilateral leg braces without edema. Distal pulses are  diminished.  NEUROLOGIC:  Grossly intact.  He is awake, alert, oriented, and cooperative.   IMPRESSION:  1.  Abnormal lower extremity arterial  Doppler study suggesting in-stent      restenosis.  2.  Widespread peripheral vascular disease with previous interventions.  3.  Coronary artery bypass grafting with recent negative Cardiolite.  4.  Chronic back pain and debilitation after back surgery.  5.  Treated hyperlipidemia.  6.  Treated hypertension.   PLAN:  The patient is admitted by Dr. Allyson Sabal for elective angiogram.      [name], P.A.      Nanetta Batty, M.D.  Electronically Signed    LKK/MEDQ  D:  12/22/2005  T:  12/22/2005  Job:  [id number]

## 2011-04-23 NOTE — Cardiovascular Report (Signed)
NAMEJADIN, CREQUE NO.:  [id number]   MEDICAL RECORD NO.:  [id number]          PATIENT TYPE:  AMB   LOCATION:  SDS                          FACILITY:  [facility]   PHYSICIAN:  Nanetta Batty, M.D.   DATE OF BIRTH:  06/12/1929   DATE OF PROCEDURE:  11/09/2005  DATE OF DISCHARGE:                              CARDIAC CATHETERIZATION   Mr. Scheel is a 77 year old gentleman with a history of coronary artery  bypass grafting in 1977.  He has had stenting of his right common and  external iliac artery as well as left carotid endarterectomy.  He has  moderate left subclavian artery stenosis.  He does complain of right lower  extremity claudication and Dopplers suggest potential for restenosis in the  right external iliac artery.  He also has high grade disease in the mid  right SFA.  He presents now for angiography and potential intervention for  treatment of symptoms of functional limiting claudication.   DESCRIPTION OF PROCEDURE:  The patient was brought to the sixth floor [facility] Angiographic Suite in the postabsorptive state.  He  was premedicated with p.o. Valium.  His right groin was prepped and shaved  in the usual sterile fashion.  1% Xylocaine was used for local anesthesia.  A 5 French sheath was inserted into the right femoral artery using standard  Seldinger technique.  A 5 French tennis racquet catheter was used for distal  abdominal aortography with bilateral femoral run off.  Visipaque dye was  used for the entirety of the case.  Retrograde aortic pressures were  monitored during the case.   ANGIOGRAPHIC RESULTS:  1.  Abdominal aorta:      1.  Renal arteries patent.      2.  Infrarenal abdominal aorta - moderate atherosclerotic changes.  2.  Left lower extremity:      1.  Diffuse nonobstructive atherosclerosis in the left common and          external iliac artery.  There was 80-90% long segmental disease in          the mid left  SFA.  The inferior popliteal vasculature was not          adequately assessed.  3.  Right lower extremity:      1.  Patent right common and external iliac artery stent.      2.  Long 80-90% segmental stenosis of the proximal and mid portion of          the right SFA with two vessel run off.  The anterior tibial was          occluded.   Contralateral access was attempted using a cross over catheter, Indole and  right Judkins.  I was never able to traverse the proximal stent and access  the infrainguinal vasculature to perform percutaneous revascularization of  the right SFA.  This was ultimately abandoned.   IMPRESSION:  Patent right external and common iliac stent with long, diffuse  disease in the SFAs bilaterally.  I believe the patient will require  continued medical therapy.  The ACT was measured and the sheaths removed.  Pressure was held on the  groin to achieve hemostasis.  He left the lab in stable condition.  He will  be hydrated and discharged later today as an outpatient and I will see him  back in two weeks for follow up.      Nanetta Batty, M.D.  Electronically Signed     JB/MEDQ  D:  11/09/2005  T:  11/09/2005  Job:  [id number]   cc:   Julieanne Manson  Fax: [phone]

## 2011-04-23 NOTE — H&P (Signed)
[name], [name]              ACCOUNT NO.:  [id number]   MEDICAL RECORD NO.:  [id number]          PATIENT TYPE:  AMB   LOCATION:  SDS                          FACILITY:  [facility]   PHYSICIAN:  Nanetta Batty, M.D.   DATE OF BIRTH:  1929-04-03   DATE OF ADMISSION:  11/09/2005  DATE OF DISCHARGE:  11/09/2005                                HISTORY & PHYSICAL   HISTORY OF PRESENT ILLNESS:  Mr. Chacko is a 77 year old male followed by  Dr. Allyson Sabal with prior bypass grafting in 1997. He had a Cardiolite in  December 2004 which showed no ischemia with good LV function.  He has had  peripheral vascular disease with prior right iliac artery stenting.  He has  also had apparently left common iliac artery done in December 2005. He was  seen by Dr. Allyson Sabal in September 2006. Dopplers and followup Cardiolite were  ordered.  His Dopplers did suggest in-stent restenosis of the right iliac  stent.  He is admitted now for elective intervention.   PAST MEDICAL HISTORY:  Remarkable for multiple back operations with  bilateral foot drop.  He wears braces on both legs.  He is on chronic pain  medications.  He has treated hyperlipidemia.   HOME MEDICATIONS:  1.  Prozac 20 mg a day.  2.  Valium 5 mg 3 times a day.  3.  Darvocet 4 tablets a day.  4.  OxyContin 4 mg a day.  5.  Soma 3 times a day.  6.  Aspirin 325 mg every other day.  7.  Lovastatin 40 mg at bedtime.  8.  Niacin at bedtime.  9.  Doxazosin 2 mg 1/2 tablet every day.  10. Benazepril 10 mg a day.   ALLERGIES:  Reported allergy to PENICILLIN.   SOCIAL HISTORY:  He is married.  He has no children.  He currently does not  smoke.   FAMILY HISTORY:  Unremarkable.   REVIEW OF SYSTEMS:  Essentially unremarkable except as above.  Cardiolite  August 18, 2005, showed scar in the RCA territory without ischemia.   PHYSICAL EXAMINATION:  VITAL SIGNS:  Blood pressure 128/68, pulse 83,  temperature 97, weight 210.  GENERAL:  He is a  well-developed, well-nourished male in no acute distress.  HEENT: Normocephalic.  NECK:  Without JVD or thyromegaly.  CHEST: Clear to auscultation and percussion.  CARDIAC:  Regular rate and rhythm with S1 and S2.  EXTREMITIES:  Bilateral leg braces.  Trace lower extremity edema.  NEUROLOGIC:  Exam is grossly intact.   IMPRESSION:  1.  Peripheral vascular disease with recent Dopplers suggesting right iliac      artery stenosis.  2.  Peripheral vascular disease with prior right iliac stent placement and      history of left common iliac artery stenting.  3.  Coronary disease, coronary artery bypass grafting in 1997 with recent      negative Cardiolite.  4.  Chronic back pain and dysmotility.  5.  Dyslipidemia.   DISPOSITION:  The patient will be admitted for outpatient elective  peripheral angiogram.  Abelino Derrick, P.A.      Nanetta Batty, M.D.  Electronically Signed    LKK/MEDQ  D:  01/03/2006  T:  01/03/2006  Job:  [id number]

## 2011-04-23 NOTE — Procedures (Signed)
[place]  Patient:    [name], [name]                     MRN: [id number] Adm. Date:  [id number] Attending:  Berry, Jonathan [place] CC:         Franne Forts, M.D.   Procedure Report  [facility] Record No. [phone]  HISTORY:  Mr. [name] is a 77 year old married white male with a history of CAD status post CABG in July 1997.  I performed emergent right iliac artery PTA and stenting in the setting of a dissected common iliac during test.  He stopped smoking four years ago.  His other problems include hyperlipidemia. He has low back pain with peripheral manifestations.  Dopplers performed on August 1 revealed AVIs of 0.71 on the right with monophasic femoral waveforms and 0.74 on the left with biphasic femoral waveforms.  He is limited with claudication.  He presents for abdominal aortography, bifemoral and ______ potential intervention.  DESCRIPTION OF PROCEDURE:  The patient was brought to the sixth floor [facility] Peripheral Vascular suite in the postabsorptive state.  He was premedicated with p.o. Valium.  His left groin was prepped and shaved in the usual sterile fashion.  Xylocaine, 1%, was used for local anesthesia.  A 6-French sheath was inserted into the left femoral artery using standard Seldinger technique.  A 5-French tennis racquet catheter and ______ catheter were used for a midstream and distal abdominal aortography.  There was bifemoral runoff.  Visipaque dye was used for the entirety of the case. Retrograde aortic pressure was monitored during the case.  ANGIOGRAPHIC RESULTS: 1. Abdominal aorta    a. Renal arteries - normal.    b. Infrarenal abdominal aorta - normal. 2. Left lower extremity    a. A 60% segmental mid left SFA.    b. Two-vessel runoff with an occluded anterior tibialis. 3. Right lower extremity    a. Right common extra iliac artery stents were widely patent.    b. The right SFA  was diffusely diseased, approximately 60%, with a focal       90% stenosis in the mid portion.    c. Two-vessel runoff with an occluded anterior tibialis.  IMPRESSION:  Mr. [name] has widely patent iliac stents on the right.  I believe his right hip and buttock pain are manifestations of his disk disease and neuromuscular/orthopedic problems.  I do not believe he has a vascular abnormality accounting for these.  His mid right SFA lesion would cause calf claudication which is not his main complaint.  The sheaths were removed, and pressure was held on the groin to achieve hemostasis.  The patient left the lab in stable condition.  He will be discharged home.  As an outpatient, he will see me back in the office in two weeks.  He left the lab in stable condition.    c. Two-vessel runoff with an occluded anterior tibialis. DD:  09/08/00 TD:  09/08/00 Job: [id number]

## 2011-04-23 NOTE — Op Note (Signed)
[place]. [facility]  Patient:    [name], [name] Visit Number: [id number] MRN: [id number]          Service Type: DSU Location: [facility] Attending Physician:  Carlean Purl Dictated by:   Kristine Garbe Ezzard Standing, M.D. Proc. Date: 05/01/02 Admit Date:  05/01/2002   CC:         Julieanne Manson, M.D.   Operative Report  PREOPERATIVE DIAGNOSIS:  Hoarseness with vocal cord lesion.  POSTOPERATIVE DIAGNOSIS:  Hoarseness with vocal cord lesion.  OPERATION:  Microlaryngoscopy with a biopsy of anterior vocal cord lesion.  SURGEON:  Kristine Garbe. Ezzard Standing, M.D.  ANESTHESIA:  General endotracheal.  COMPLICATIONS:  None.  BRIEF CLINICAL NOTE:  [name] is a 77 year old gentleman who has had hoarseness now for about 2 months to 6 weeks.  He has had previous history of a vocal cord cancer diagnosed in 1990, treated in [place] with laser.  He has done well for a number of years.  He had been followed by myself since 1998.  He quit smoking in 1997 following bypass surgery.  On recent exam in the office after six weeks of hoarseness, he had an erythematous lesion noted in the anterior commissure region.  He was taken to the operating room at this time for direct laryngoscopy and biopsy of erythematous lesion.  DESCRIPTION OF PROCEDURE:  After adequate endotracheal anesthesia, a direct laryngoscopy was performed.  The base of the tongue and epiglottis was normal to evaluation.  The piriform sinuses were clear.  On examination of the anterior commissure, the patient had an erythematous, indurated lesion that involves the vocal cords bilaterally, more on the right side and involves mostly the anterior commissure.  It was firm, indurated, and bled easily, consistent with probable carcinoma.  A biopsy was obtained. Photos were obtained.  After obtaining several biopsies, the patient was awakened from anesthesia and transferred to the recovery room,  postoperatively doing well.  DISPOSITION:  The patient is subsequently discharged home on his regular medications.  He is also given a prescription for Tylenol #3 for pain. The patient is instructed to call my office in two days for results of the biopsy. I suspect this will probably represent carcinoma, and we will arrange for patient for followup after results of the biopsy with radiation oncologist. Dictated by:   Kristine Garbe. Ezzard Standing, M.D. Attending Physician:  Carlean Purl DD:  05/01/02 TD:  05/02/02 Job: [id number]

## 2011-04-23 NOTE — Discharge Summary (Signed)
NAMEODEAN, FESTER              ACCOUNT NO.:  [id number]   MEDICAL RECORD NO.:  [id number]          PATIENT TYPE:  INP   LOCATION:  3713                         FACILITY:  [facility]   PHYSICIAN:  Thereasa Solo, M.D.  DATE OF BIRTH:  [birth year]   DATE OF ADMISSION:  06/23/2006  DATE OF DISCHARGE:                                 DISCHARGE SUMMARY   DISCHARGE DIAGNOSES:  1.  Chief complaint: I feel lightheaded and weak.  2.  Development of paresthesias in bilateral fingers and hands (most likely      due to cervical stenosis in C4/C5).  3.  Anterior vocal coronary disease carcinoma, status post chemotherapy in      1990 and status post wide excision in December, 2004.  4.  Status post left carotid endarterectomy in June, 2002.  5.  Status post right iliac artery PTA with stenting in the setting of a      dissected common iliac in October, 2001 with in-stent restenosis to the      right iliac stent.  6.  Left subclavian artery stenosis.  7.  Coronary artery bypass graft x3 vessels in 1997.  8.  Constipation.  9.  Colon polyps, diagnosed 3 years ago by Dr. Lillard Anes in [place], [place].  10. Status post lumbar discectomy in 1996 and again in 1974 x2 discs.  11. Status post cervical discectomy in 1977.  12. Status post right ulnar nerve transposition.  13. Dyslipidemia.  14. Peripheral vascular disease, most likely due to extensive smoking      history.  15. Treated hypertension.  16. Chronic pain and disability secondary to nerve damage status post back      surgery.   DISCHARGE MEDICATIONS:  1.  Benazepril 10 mg p.o. daily.  2.  Cilostazol 50 mg p.o. b.i.d.  3.  Valium 5 mg p.o. t.i.d..  4.  Proscar 5 mg p.o. daily.  5.  Prozac 20 mg p.o. q.h.s.  6.  Neurontin 200 mg p.o. b.i.d.  7.  Zocor 20 p.o. q.h.s.  8.  Darvocet-N 100 one pill q.6 h. p.r.n. for pain.  9.  Soma 350 mg p.o. t.i.d.  10. Aspirin 325 mg p.o. daily.   CONDITION ON DISCHARGE:  Stable.   FOLLOW UP  APPOINTMENTS:  The patient will follow up with Dr. Sullivan Lone, [phone], the office was unavailable today and I could not schedule a follow up  appointment for the patient.  I have instructed him to call the clinic at  his earliest convenience to schedule a follow up appointment within 2 weeks  and I have also called the clinic and left a message on their answering  machine stating this.   PROCEDURES:  During the patient's hospitalization the patient had:  1.  Angiogram of the vertebral arteries, which revealed widely patent      subclavian vertebral arteries.  Radiologist did not think that he had      posterior circulation symptoms or anatomy that would contribute to this.      He also believes that his previous MRA was a false positive.  This was  done by Dr. Allyson Sabal in the department of cardiology.  2.  Also the patient had an echocardiogram during his stay, which revealed      an overall left ventricular systolic function was normal.  No left      ventricular regional wall motion abnormalities.  There was mild to      moderate thickening of the mitral valve without significant restriction      of leaflet motion, but a mild mitral valve annular calcification and a      mild mitral valvular regurgitation.  The left atrium was mildly dilated,      left ventricle was moderately dilated, and there was a mild right      ventricular hypertrophy.  The right atrium was mildly to moderately      dilated.  3.  MRI of the brain with and without contrast revealed a chronic left      parietal lobe infarct, no acute infarct, and a mild chronic sinusitis.  4.  MRA of the head revealed a moderate to severe disease in the midleft      posterior cerebral artery.  5.  MRI of the neck showed a moderate to severe stenosis of the proximal      right subclavian artery.  There is questionable stenosis in the proximal      vetebral arteries bilaterally versus artifact.  6.  MRI of the cervical spine showed  a interbody fusion at C5/C6, mild      spinal stenosis at C3/C4, and mild to moderate stenosis of the canal at      C4/C5 and C6/C7 due to disc protrusion and to spondylosis.  7.  MRI of the lumbar spine revealed a multilevel disc degeneration and      spondylosis.  There is a moderately severe spinal stenosis in L2/L3 and      L3/L4.  Foraminal narrowing at multiple levels due to spondylar change,      most prominent bilaterally at L4/L5 and L5/S1.  8.  Chest x-ray during the patient's stay revealed no acute disease.   CONSULTATIONS:  1.  Dr. Allyson Sabal, department of cardiology.  2.  Dr. Lovell Sheehan, department of neurosurgery.   BRIEF ADMISSION HISTORY AND PHYSICAL:  The patient is a 77 year old white  male with severe peripheral vascular disease, coronary artery disease, and  peripheral neuropathy and chronic back pain syndrome who has a 35-week  history of gradually increasing lightheadedness and near syncopal spells.  This is not associated with head position, time of day, or activity.  The  patient denies fevers or chills, or any recent significant change in his  medications except for the addition of some constipation medications.  He  was last seen by his primary care Akon Reinoso in the past week, when labs were  drawn and the patient was given a vitamin B12 injection.  The patient  subsequently saw his GI physician the following day, where he was told that  all his labs were normal.  The morning or admission and the day prior to  admission he had noted a progressive worsening of his lightheadedness and  marked fatigue, as well as shortness of breath, no chest pain or chest  pressure, however.   ALLERGIES:  THE PATIENT IS ALLERGIC TO PENICILLIN, WHERE HE CAN HAVE  ANAPHYLAXIS.   SOCIAL HISTORY:  Notable for a former smoking history of 3 packs per day x60  years.  The patient is retired with Medicare.  REVIEW OF SYSTEMS:  Review of  systems is also significant for constipation,  and  numbness in the patient's hands and fingers, as well as feet.  Also  significant for back pain, which has been chronic and without any acute  changes.  The patient also reports a history of anxiety.   PHYSICAL EXAMINATION:  VITAL SIGNS:  On admission, temperature was 96.9,  blood pressure was 98/44, pulse of 71, respirations of 18, O2 sat of 94% on  room air.  GENERAL:  The patient is alert and oriented x4, in no acute distress.  The  patient is resting, calm.  The patient did have noticeable orthostatic  hypotension.  Blood pressure when supine was 105/75, pulse was 93.  Sitting  blood pressure was 167/46, pulse was 73.  Standing blood pressure was  154/34, pulse was 83.  NECK:  No neck bruits were appreciated.  RESPIRATORY:  Lungs clear to auscultation bilaterally.  CARDIOVASCULAR:  The patient did have faint 1-2 dorsalis pedis or posterior  tibialis pulse in the right leg with positive capillary refill to the toes.  Pulses to the left feet were more easily palpable than the right side.  EXTREMITIES:  Cannot appreciate and cyanosis, clubbing, or edema.  SKIN:  No petechiae.  Normal turgor.  It was warm and well perfused.  Noticeable scar on lower back.  MUSCULOSKELETAL:  The patient had 2/5 grip, 3/5 biceps and triceps  bilaterally, 3/5 bilateral hip flexors, 1-2/5 bilateral hip extensors, 0/5  bilaterally ankle dorsiflexion and ankle plantar flexion.  NEURO:  Cranial nerves II-XII are grossly intact.  Distal sensory loss to  bilateral fingertips.  PSYCHIATRIC:  The patient displayed a mild anxiety.   ADMISSION LABS:  Sodium of 140, potassium of 4.5, chloride of 106, CO2 of  26.5, BUN of 8, creatinine of 1.0, and glucose of 93.  White blood cell  count was 6.6, hemoglobin was 12.3, hematocrit was 37.4, platelets were  154,000, ANC was 3.6, and MCV was 93.5.  EKG was normal sinus rhythm with  right bundle branch block.  CK was 83 and troponin I was less than 0.05 x3.  Chest x-ray  showed no active cardiopulmonary disease or any acute process.   HOSPITAL COURSE:  1.  Chief complaint of lightheadedness and fatigue:  We felt that the      patient's complaint of lightheadedness and fatigue was due to vertebral      artery insufficiency, which was seen on a MRA, which revealed a possible      moderate stenosis of the bilateral vetebral arteries.  We subsequently      consulted the cardiology service her and they performed a      catheterization of these vessels, however, they did not find any      stenosis and the vessels were read as patent with no abnormalities.  In      fact, on the catheterization, the radiologist thought that the MRA was a      false positive result.  The echocardiogram was within normal limits,      except for some mild mitral valve abnormalities as noted on the report      above.  We believed that it was possible that the patient's Cardura or doxazosin, which he was taking for a BPH, could possibly cause orthostatic hypotension  in the patient.  We felt this because we had discontinued the Cardura or  doxazosin when the patient arrived at the hospital and throughout the  patient's stay we noted good blood pressures,  as well as the patient's  lightheadedness, fatigue, and headache had all resolved.  The patient was  notified of this theory that the Cardura was causing his symptoms.  1.  Hand and finger paresthesias:  The patient had reported some chronic      numbness in the bilateral hands and fingertips.  Dr. Lovell Sheehan from      neurosurgery came out to evaluate the patient and he decided that these      symptoms were due to the history of cervical stenosis in C4/C5 and the      surrounding vertebrae.  He advised that surgery was not recommended at      this time.  The patient was also sent home on aspirin 325 mg daily.  2.  Hypertension:  It was well controlled during the patient's stay and he      was sent home on the blood pressure medicines on  which he arrived.      Please notice again here that we have discontinued the patient's Cardura      or Doxazosin and told him that this may have been the cause of his      lightheadedness and orthostatic hypotension.  3.  Hyperlipidemia:  The patient is taking Zocor.  4.  Constipation:  The patient was given Dulcolax p.r.n.  5.  Benign prostatic hypertrophy:  The patient was medicated with Proscar.  6.  History of vocal coronary disease carcinoma:  The patient is      asymptomatic and currently in remission.  7.  History of coronary artery disease:  The patient is to continue his      current medication.  Echocardiogram during the patient's stay was within      normal limits, except for the mild abnormalities noted above.   DISCHARGE LABS AND VITALS:  Sodium of 140, potassium of 4.6, chloride of  106, CO2 of 25, BUN of 10, creatinine of 0.8, and glucose of 88.  White  blood cell count was 87, hemoglobin was 13.7, hematocrit was 41.7, platelets  were 198,000, and calcium was 9.0.  Temperature of 98.3, blood pressure of  128/60, pulse of 67, respirations of 20, and O2 sat of 95% on room air.      Thereasa Solo, M.D.  Electronically Signed     AS/MEDQ  D:  06/28/2006  T:  06/28/2006  Job:  [id number]   cc:   Madaline Guthrie, M.D.  Fax: [phone]   Julieanne Manson  Fax: [phone]   [place] Service, M.D.  Fax: [phone]   Nanetta Batty, M.D.  Fax: [phone]   Cristi Loron, M.D.  Fax: [phone]

## 2011-04-23 NOTE — Op Note (Signed)
NAME:  EUSTACE, HUR                        ACCOUNT NO.:  [id number]   MEDICAL RECORD NO.:  [id number]                   PATIENT TYPE:  AMB   LOCATION:  DSC                                  FACILITY:  [facility]   PHYSICIAN:  Kristine Garbe. Ezzard Standing, M.D.         DATE OF BIRTH:  [birth year]   DATE OF PROCEDURE:  06/30/2004  DATE OF DISCHARGE:                                 OPERATIVE REPORT   PREOPERATIVE DIAGNOSES:  Dysphagia with history of hypopharyngeal carcinoma.  History of anterior vocal cord carcinoma.   POSTOPERATIVE DIAGNOSES:  Dysphagia with history of hypopharyngeal  carcinoma.  History of anterior vocal cord carcinoma.   OPERATION:  Direct laryngoscopy.  Cervical esophagoscopy.   SURGEON:  Dr. Narda Bonds.   ANESTHESIA:  General endotracheal.   COMPLICATIONS:  None.   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  At the time of wide local excision, there was a  questionable positive margin and he was taken back to the operating room at  this time for re-examination of the hypopharyngeal region seven months  postoperatively.  He has complained of some intermittent dysphagia.   DESCRIPTION OF PROCEDURE:  After adequate endotracheal anesthesia, first a  direct laryngoscopy was performed.  The base of tongue, vallecula, tonsil  regions all appeared benign.  The epiglottis was normal.  The A. folds and  piriform sinuses were clear.  The posterior hypopharyngeal wall where the  previous carcinoma was excised seven months ago appeared benign with normal-  appearing mucosa.  Photos were obtained of this region.  Next, the vocal  cords and false cords were examined.  The cords appeared clear.  The  anterior commissure specifically was clear.  Photos again were obtained.  Following this, a rigid cervical esophagoscopy was performed.  Both distal  port and piriform sinuses were clear.  The cervical esophagus was clear with  normal-appearing mucosa.  There was no stricture and esophagoscopy was  easily performed.  This completed the procedure.  No biopsies were obtained  as the patient had essentially normal hypopharyngeal laryngeal examination  and endoscopy.  The procedure was completed.  The patient was awoken from  anesthesia and transferred to the recovery room postoperatively doing well.   DISPOSITION:  Jaloni is discharged home later this morning on Tylenol p.r.n.  pain.  We will have him follow up in my office in two months for recheck.                                               Kristine Garbe. Ezzard Standing, M.D.    CEN/MEDQ  D:  06/30/2004  T:  06/30/2004  Job:  [id number]

## 2011-04-23 NOTE — Op Note (Signed)
[facility]  Patient:    Jon, Ward                     MRN: [id number] Proc. Date: 05/19/01 Adm. Date:  [id number] Attending:  Meredith Leeds CC:         Runell Gess, M.D.   Operative Report  PREOPERATIVE DIAGNOSIS:  Severe left internal carotid stenosis.  POSTOPERATIVE DIAGNOSIS:  Severe left internal carotid stenosis.  OPERATION:  Left carotid endarterectomy.  SURGEON:  Zigmund Daniel, M.D.  ASSISTANT:  Thornton Park. Daphine Deutscher, M.D.  ANESTHESIA:  General.  DESCRIPTION OF PROCEDURE:  After patient had arterial monitor and EKG monitor and induction of general anesthesia and routine preparation and draping of the left side of the neck, I made a short incision in the mid-neck, paralleling the anterior border of the sternocleidomastoid, got hemostasis on the skin with the Bovie, deepened my dissection through the platysma and superficial cervical fascia and approached the carotid pulse.  I ligated the common facial vein in continuity using 2-0 silk and divided it, leaving a nice stump on the internal jugular vein.  I then dissected to the carotid pulse and identified and controlled the common internal and external carotids and the superior thyroid artery.  I visualized and spared the ansa cervicalis, the hypoglossal nerve and the vagus nerve.  After getting control and exposure, I had the anesthesiology personnel give the patient 6000 units of heparin and allowed that to circulate for two minutes.  I then clamped the common carotid and tightened the controls on the internal and external and made a longitudinal arteriotomy from the common carotid into the internal carotid, all the way across the stenotic degenerated plaque up until it thinned out into normal vessel wall.  I then inserted a 12-French straight heparinized shunt, allowed blood to flow back from the internal carotid first and then put it into the common carotid and  restored the flow; I kept it in place while I performed the endarterectomy.  I used a dissector and created a plane between the diseased vessel wall and healthy median adventitia.  I transected the plaque proximally in the common carotid and worked up and transected it again in the proximal internal carotid.  I then intussuscepted it out of the external carotid, got good backflow from the external carotid and filled it with heparinized saline and reapplied the control.  I then carefully dissected it up into the internal carotid until it thinned out and very carefully divided it with fine scissors at that location.  It appeared to me that there was slight thickening of the vessel wall and plaque posteriorly and I thought there might be a slight tendency for dissection so I used three sutures to tack down the plaque to the vessel wall, placing the stitches from the inside-to-out and tying the stitches outside the vessel.  I then irrigated vigorously and so that the plaque did not dissect up.  I carefully removed loose debris and muscle fibers from the endarterectomized segment of the vessel.  I then used a Hemashield ______ patch and closed the arteriotomy, starting the suture line in the internal carotid and sewing down into the common.  When a few sutures remained to be placed, I removed the shunt.  Again, there was good back-bleeding and good forward bleeding.  I irrigated the vessel out and completed the suture line.  I then restored flow first into the external carotid and then  into the internal carotid.  There was an excellent distal pulse in both vessels.  There were no evident stenotic areas.  I used a couple more sutures where there were flutes that were bleeding and then hemostasis was excellent.  I closed the superficial fascia and platysma with running layers of 3-0 Vicryl and closed the skin with intercuticular 4-0 Vicryl and Steri-Strips.  He was stable through the operation and  neurologically intact upon awakening. DD:  05/19/01 TD:  05/20/01 Job: [id number]

## 2011-09-08 ENCOUNTER — Ambulatory Visit
Admission: RE | Admit: 2011-09-08 | Discharge: 2011-09-08 | Disposition: A | Discharge status: Home / Self Care | Payer: Medicare Other | Source: Ambulatory Visit | Attending: Cardiovascular Disease | Admitting: Cardiovascular Disease

## 2011-09-08 ENCOUNTER — Other Ambulatory Visit: Payer: Self-pay | Admitting: Cardiovascular Disease

## 2011-09-08 DIAGNOSIS — R0602 Shortness of breath: Secondary | ICD-10-CM

## 2011-09-08 DIAGNOSIS — Z01811 Encounter for preprocedural respiratory examination: Secondary | ICD-10-CM

## 2011-09-14 ENCOUNTER — Ambulatory Visit (HOSPITAL_COMMUNITY)
Admission: RE | Admit: 2011-09-14 | Discharge: 2011-09-14 | Disposition: A | Discharge status: Home / Self Care | Payer: Medicare Other | Source: Ambulatory Visit | Attending: Cardiovascular Disease | Admitting: Cardiovascular Disease

## 2011-09-14 DIAGNOSIS — I723 Aneurysm of iliac artery: Secondary | ICD-10-CM | POA: Insufficient documentation

## 2011-09-14 DIAGNOSIS — I70209 Unspecified atherosclerosis of native arteries of extremities, unspecified extremity: Secondary | ICD-10-CM | POA: Insufficient documentation

## 2011-09-14 DIAGNOSIS — E785 Hyperlipidemia, unspecified: Secondary | ICD-10-CM | POA: Insufficient documentation

## 2011-09-14 DIAGNOSIS — Z951 Presence of aortocoronary bypass graft: Secondary | ICD-10-CM | POA: Insufficient documentation

## 2011-09-14 DIAGNOSIS — T82898A Other specified complication of vascular prosthetic devices, implants and grafts, initial encounter: Secondary | ICD-10-CM | POA: Insufficient documentation

## 2011-09-14 DIAGNOSIS — I10 Essential (primary) hypertension: Secondary | ICD-10-CM | POA: Insufficient documentation

## 2011-09-14 DIAGNOSIS — E663 Overweight: Secondary | ICD-10-CM | POA: Insufficient documentation

## 2011-09-14 DIAGNOSIS — I2510 Atherosclerotic heart disease of native coronary artery without angina pectoris: Secondary | ICD-10-CM | POA: Insufficient documentation

## 2011-09-14 DIAGNOSIS — I739 Peripheral vascular disease, unspecified: Secondary | ICD-10-CM

## 2011-09-14 DIAGNOSIS — Y831 Surgical operation with implant of artificial internal device as the cause of abnormal reaction of the patient, or of later complication, without mention of misadventure at the time of the procedure: Secondary | ICD-10-CM | POA: Insufficient documentation

## 2011-09-17 ENCOUNTER — Ambulatory Visit (HOSPITAL_COMMUNITY)
Admit: 2011-09-17 | Discharge: 2011-09-17 | Disposition: A | Discharge status: Home / Self Care | Payer: Medicare Other | Attending: Cardiovascular Disease | Admitting: Cardiovascular Disease

## 2011-09-17 ENCOUNTER — Encounter (HOSPITAL_COMMUNITY): Payer: Self-pay

## 2011-09-17 DIAGNOSIS — R209 Unspecified disturbances of skin sensation: Secondary | ICD-10-CM | POA: Insufficient documentation

## 2011-09-17 DIAGNOSIS — I70219 Atherosclerosis of native arteries of extremities with intermittent claudication, unspecified extremity: Secondary | ICD-10-CM | POA: Insufficient documentation

## 2011-09-17 DIAGNOSIS — I708 Atherosclerosis of other arteries: Secondary | ICD-10-CM | POA: Insufficient documentation

## 2011-09-17 DIAGNOSIS — R911 Solitary pulmonary nodule: Secondary | ICD-10-CM | POA: Insufficient documentation

## 2011-09-17 DIAGNOSIS — I700 Atherosclerosis of aorta: Secondary | ICD-10-CM | POA: Insufficient documentation

## 2011-09-17 DIAGNOSIS — I723 Aneurysm of iliac artery: Secondary | ICD-10-CM | POA: Insufficient documentation

## 2011-09-17 HISTORY — DX: Essential (primary) hypertension: I10

## 2011-09-17 HISTORY — DX: Peripheral vascular disease, unspecified: I73.9

## 2011-09-17 LAB — COMPREHENSIVE METABOLIC PANEL
AST: 23 U/L (ref 0–37)
BUN: 9 mg/dL (ref 6–23)
CO2: 27 mEq/L (ref 19–32)
Chloride: 104 mEq/L (ref 96–112)
Creatinine, Ser: 0.71 mg/dL (ref 0.50–1.35)
GFR calc Af Amer: >90 mL/min (ref >90)
GFR calc non Af Amer: 86 mL/min — ABNORMAL LOW (ref >90)
Glucose, Bld: 87 mg/dL (ref 70–99)
Total Bilirubin: 0.4 mg/dL (ref 0.3–1.2)

## 2011-09-17 MED ORDER — IOHEXOL 350 MG/ML SOLN: 125.0000 mL | Freq: Once | INTRAVENOUS | Status: AC | PRN

## 2011-09-28 ENCOUNTER — Encounter (INDEPENDENT_AMBULATORY_CARE_PROVIDER_SITE_OTHER): Payer: Medicare Other | Admitting: Ophthalmology

## 2011-09-28 DIAGNOSIS — D3130 Benign neoplasm of unspecified choroid: Secondary | ICD-10-CM

## 2011-09-28 DIAGNOSIS — H3530 Unspecified macular degeneration: Secondary | ICD-10-CM

## 2011-09-28 DIAGNOSIS — H43819 Vitreous degeneration, unspecified eye: Secondary | ICD-10-CM

## 2011-09-28 DIAGNOSIS — H35379 Puckering of macula, unspecified eye: Secondary | ICD-10-CM

## 2011-10-08 NOTE — Procedures (Signed)
NAMEMURLIN, SCHRIEBER NO.:  [id number]  MEDICAL RECORD NO.:  [id number]  LOCATION:                                 FACILITY:  PHYSICIAN:  Nanetta Batty, M.D.   DATE OF BIRTH:  07-Feb-1929  DATE OF PROCEDURE:  09/14/2011 DATE OF DISCHARGE:                   PERIPHERAL VASCULAR INVASIVE PROCEDURE   Abdominal aortogram with bifemoral runoff using bolus-chase digital subtraction step table technique.  Mr. Slinker is a 77 year old mildly overweight married Caucasian male with no children, who I saw in the office on August 18, 2011.  He has history of coronary artery disease status post bypass grafting in 1997. He has PVOD, status post left carotid endarterectomy in the past as well as right common and external iliac artery PTA and stenting by myself remotely.  Angiography performed on July 7 revealing patent stents with high-grade bilateral SFA disease.  This appears to have positive for hypertension, hyperlipidemia.  His most recent functional study performed July 27, 2011 was nonischemic.  He has chronic dyspnea, but denies chest pain.  He walks with the [employer] walker.  Carotid Dopplers performed on August 21 showed widely patent internal carotid arteries.  Recent lower extremity Doppler revealed high-frequency signal in the right external iliac artery may since his prior study with what appeared to be a hematoma versus aneurysm measuring 3.2 x 3.1 mm in the external iliac artery.  He presents now for angiography and potential intervention.  PROCEDURE DESCRIPTION:  The patient was brought to the second floor [facility] angiographic suite in the postabsorptive state.  He was premedicated with p.o. Valium, IV Versed, and fentanyl.  His right groin was prepped and shaved in the usual sterile fashion.  1% Xylocaine was used for local anesthesia.  A 5-French sheath was inserted into the right femoral artery using standard Seldinger technique under  direct fluoroscopic control.  The femoral artery was calcified fluoroscopically.  Five-French short tennis racquet catheter, crossover catheter, end-hole was used for abdominal aortography with bifemoral runoff using bolus chase digital subtraction step table technique. Visipaque dye was used for the entirety of the case (total of 159 mL of contrast administered to the patient), retrograde aortic pressures monitored during the case.  ANGIOGRAPHIC RESULTS: 1. Abdominal aorta.     a.     Renal arteries - similar.     b.     Infrarenal abdominal aorta - tortuous, but not aneurysmal      obstructive. 2. Left lower extremity;     a.     Total left SFA (short segment/calcified) with two-vessel      runoff.  The popliteal reconstituted above the knee by profunda      femoris collaterals. 3. Right lower extremity;     a.     Proximal right common iliac artery aneurysm noted.     b.     Patent stent in the right common iliac, external iliac, and      common femoral arteries.  There appeared to be aneurysm betweentwo external iliac artery stents as well as a pullback gradient      after administration of 200 mcg of intra-arterial nitroglycerin.     c.     Two-vessel runoff  with an occluded anterior tibial.  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  There is moderate amount of in-stent restenosis within the right external iliac artery stent.  Given his aneurysms, I do not feel he is a candidate to have percutaneous intervention at this point.  I am going to get a CT angiogram on him with 1-mm cuts to further characterize his aneurysm and help decide the best form of revascularization.  Sheath was removed and pressures on the groin to achieve hemostasis. The patient left the lab in stable condition.  He will be hydrated, discharged home, and we will see him back in the office next week.     Nanetta Batty,  M.D.     Cordelia Pen  D:  09/14/2011  T:  09/14/2011  Job:  [id number]  cc:   PV angiographic suite Second Floor [facility] Julieanne Manson, MD  Electronically Signed by Nanetta Batty M.D. on 10/08/2011 05:25:57 PM

## 2011-10-25 ENCOUNTER — Ambulatory Visit: Payer: Self-pay | Admitting: Family Medicine

## 2011-11-09 ENCOUNTER — Other Ambulatory Visit (HOSPITAL_COMMUNITY): Payer: Self-pay | Admitting: Cardiovascular Disease

## 2011-11-09 DIAGNOSIS — R911 Solitary pulmonary nodule: Secondary | ICD-10-CM

## 2011-12-13 ENCOUNTER — Ambulatory Visit (HOSPITAL_COMMUNITY)
Admission: RE | Admit: 2011-12-13 | Discharge: 2011-12-13 | Disposition: A | Discharge status: Home / Self Care | Payer: Medicare Other | Source: Ambulatory Visit | Attending: Cardiovascular Disease | Admitting: Cardiovascular Disease

## 2011-12-13 DIAGNOSIS — R079 Chest pain, unspecified: Secondary | ICD-10-CM | POA: Insufficient documentation

## 2011-12-13 DIAGNOSIS — R911 Solitary pulmonary nodule: Secondary | ICD-10-CM

## 2011-12-13 DIAGNOSIS — J984 Other disorders of lung: Secondary | ICD-10-CM | POA: Insufficient documentation

## 2011-12-13 DIAGNOSIS — R634 Abnormal weight loss: Secondary | ICD-10-CM | POA: Insufficient documentation

## 2011-12-13 DIAGNOSIS — R0602 Shortness of breath: Secondary | ICD-10-CM | POA: Insufficient documentation

## 2011-12-13 MED ORDER — IOHEXOL 300 MG/ML  SOLN
80.0000 mL | Freq: Once | INTRAMUSCULAR | Status: AC | PRN
  Administered 2011-12-13: 80 mL via INTRAVENOUS

## 2012-01-22 IMAGING — CR DG CHEST 2V
1 series · 3 of 3 positions shown · non-contrast
Comparison: none

REASON FOR EXAM: cough  pneumonia
COMMENTS:

PROCEDURE:     KDR - KDXR CHEST PA (OR AP) AND LAT  - October 25, 2011  [DATE]
RESULT:     The lung fields are clear. No pneumonia, pneumothorax or pleural
effusion is seen. The heart size is normal. Post operative changes of prior
CABG are observed.

[Series 1: view not recorded · 0.17mm/px · 3 of 3 slices shown]
[im 1/3]
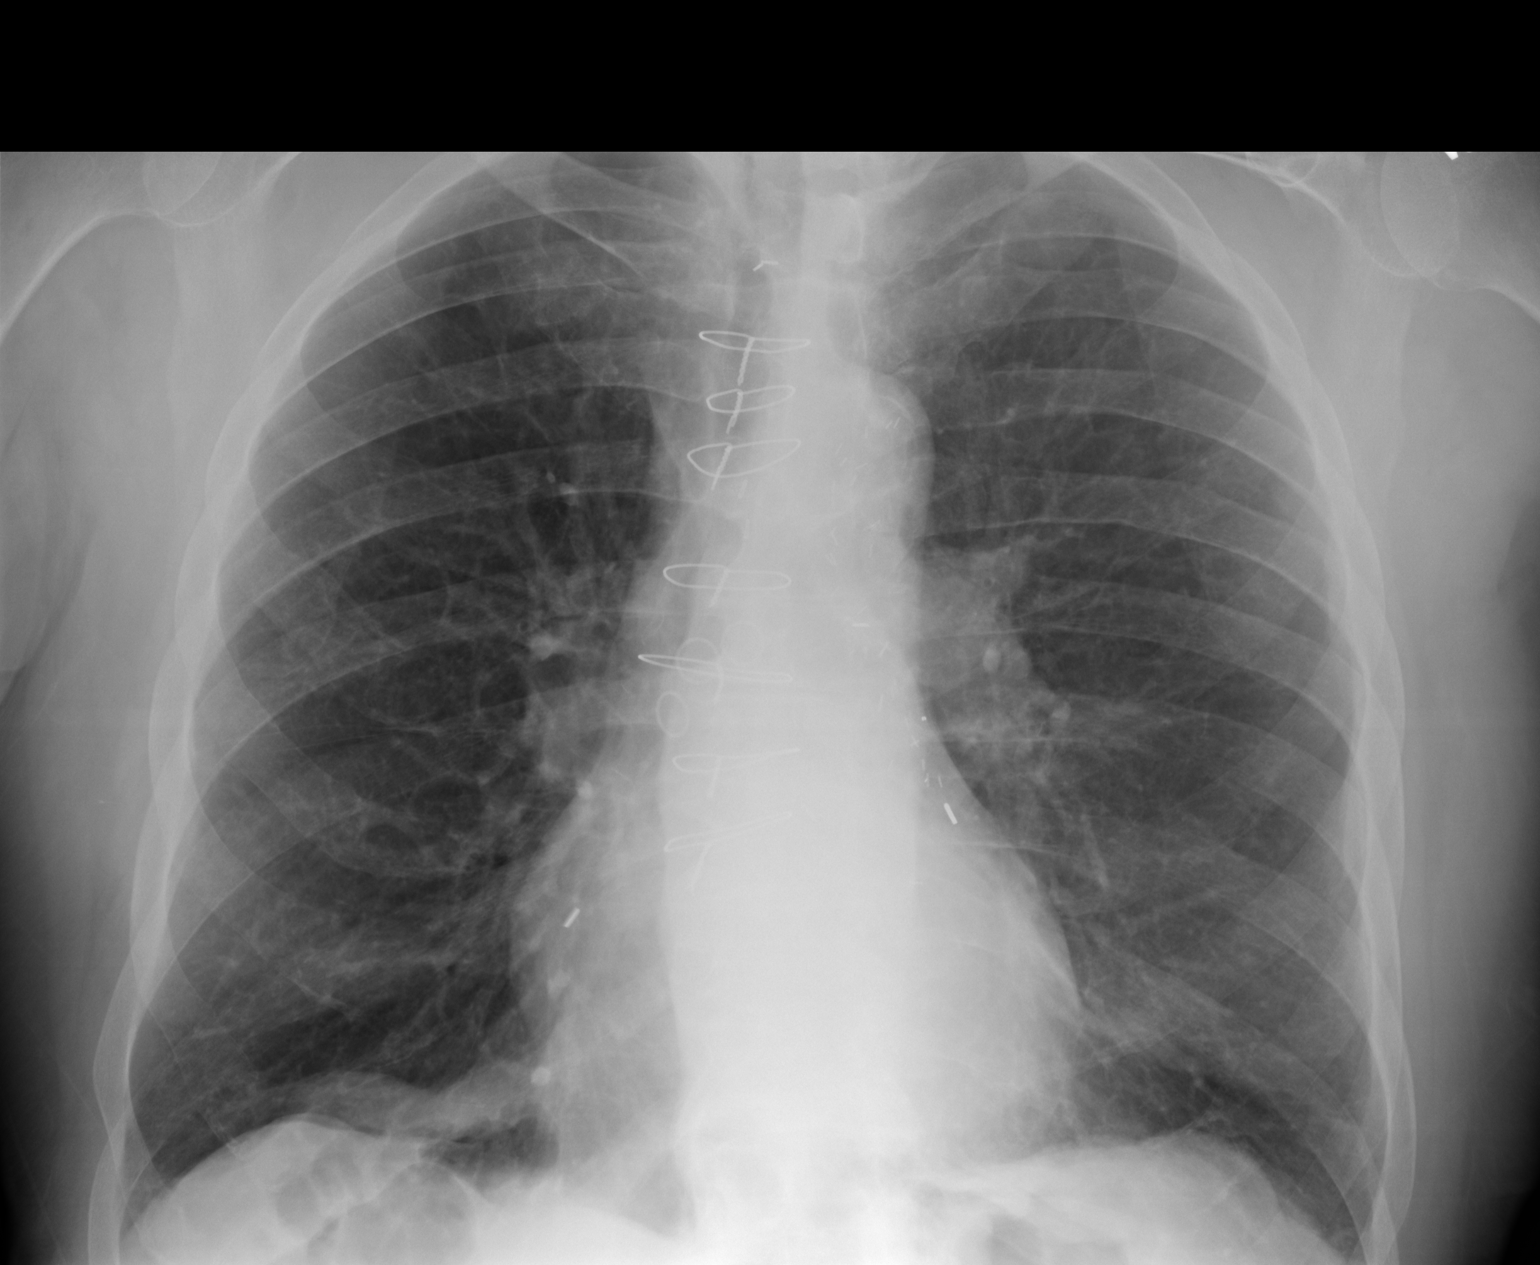
[im 2/3]
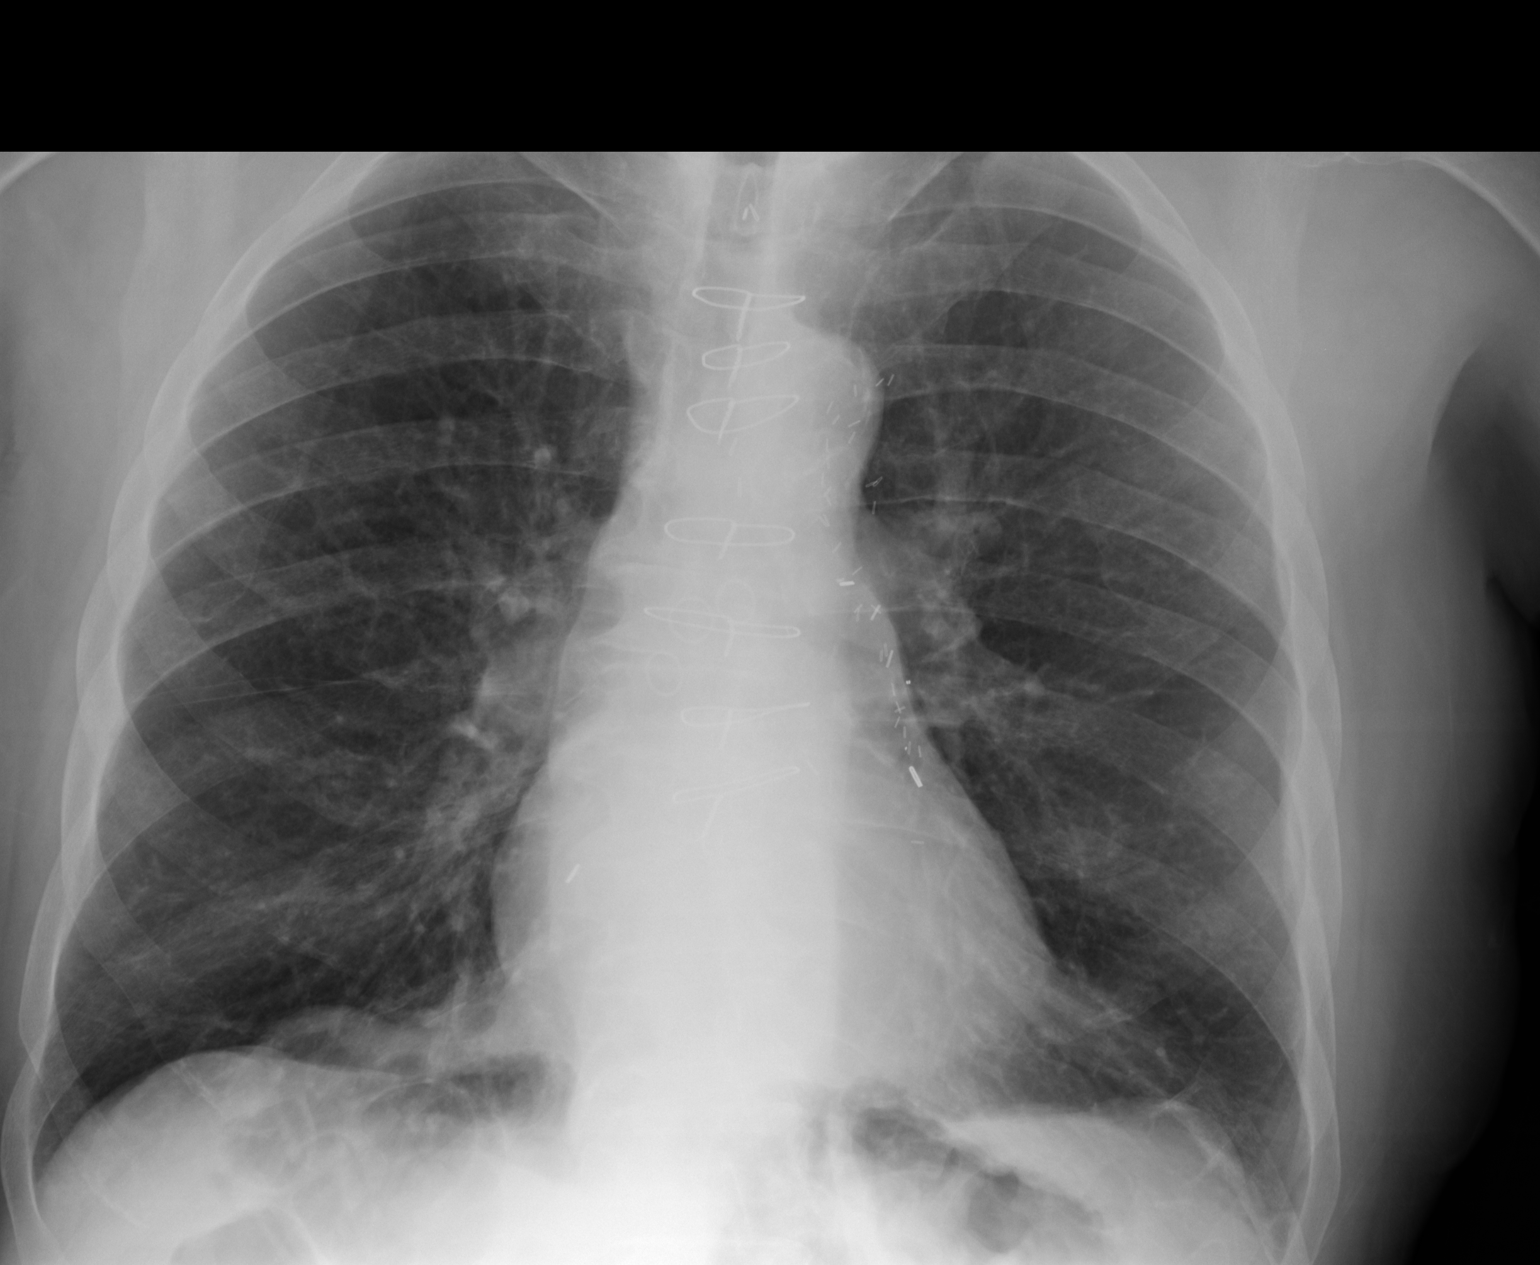
[im 3/3]
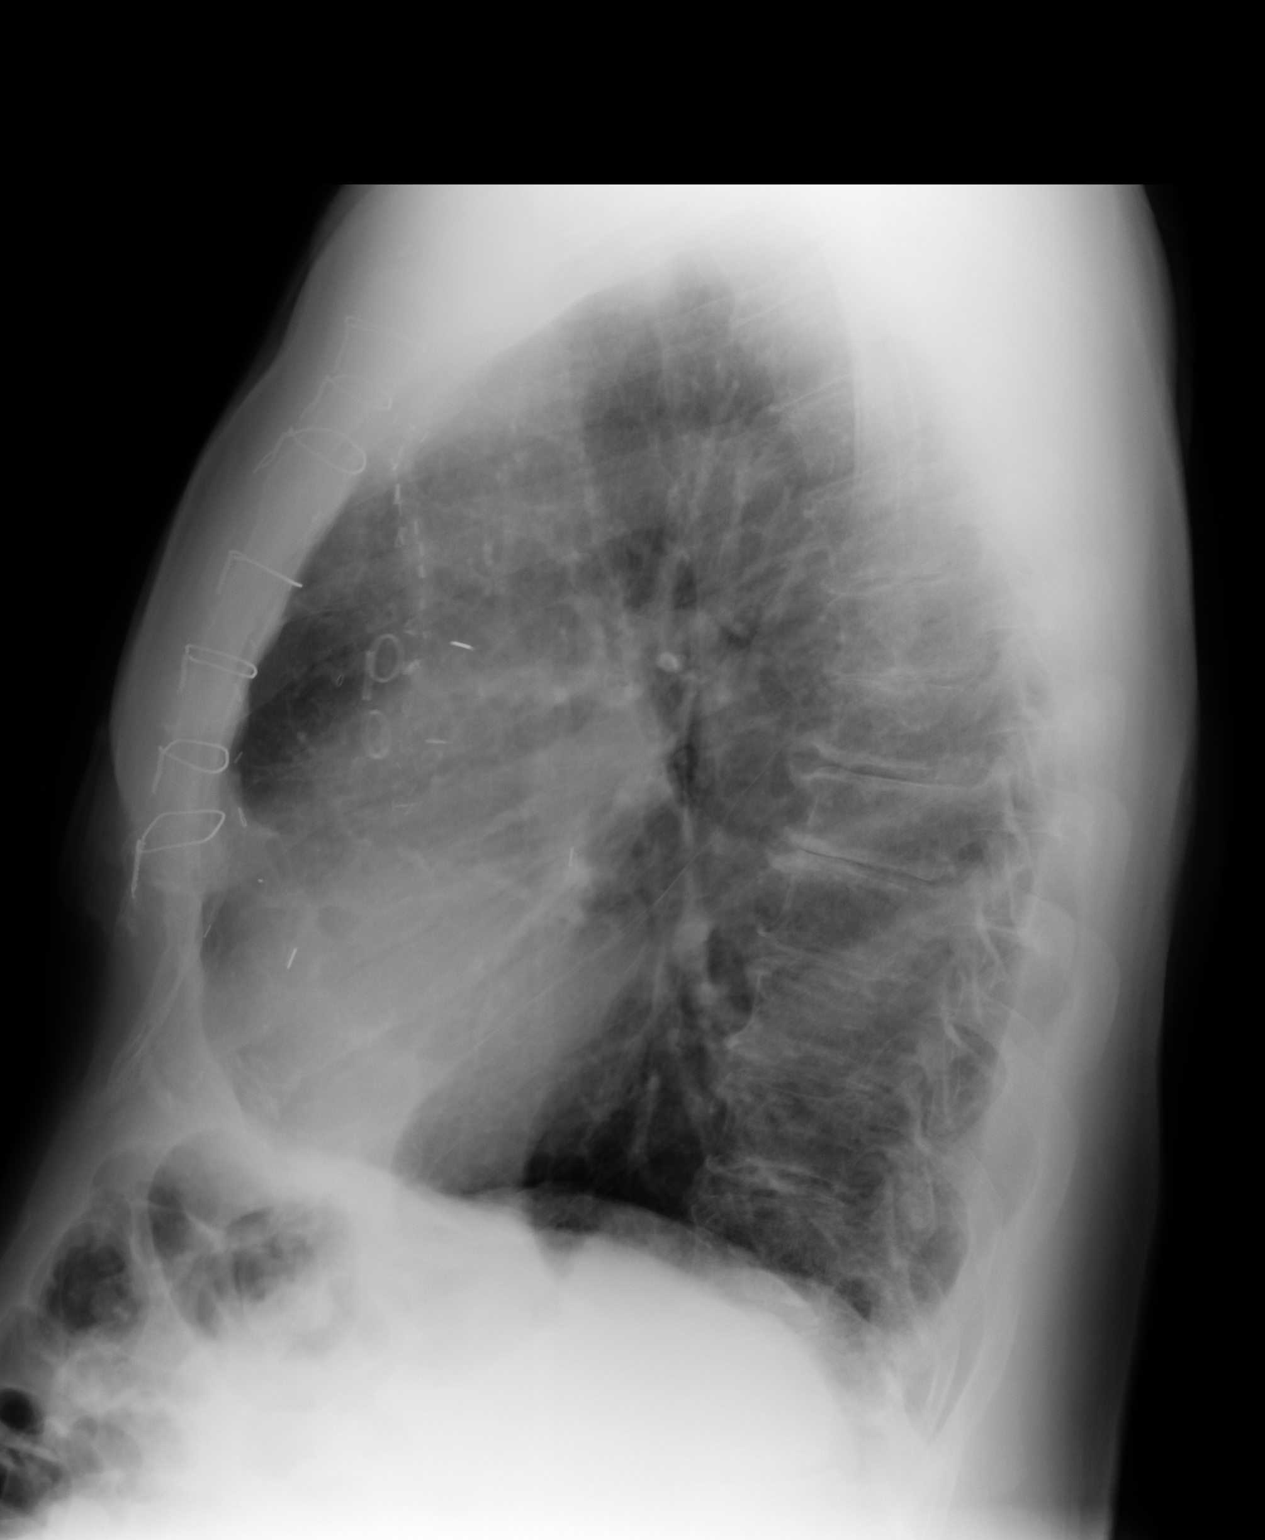

[3 of 3 positions shown; findings below may reference images not displayed]

IMPRESSION: No acute changes are identified.

## 2012-03-28 ENCOUNTER — Ambulatory Visit (INDEPENDENT_AMBULATORY_CARE_PROVIDER_SITE_OTHER): Payer: Medicare Other | Admitting: Ophthalmology

## 2012-04-09 ENCOUNTER — Emergency Department: Payer: Self-pay | Admitting: *Deleted

## 2012-04-09 ENCOUNTER — Emergency Department: Payer: Self-pay | Admitting: Emergency Medicine

## 2012-04-09 LAB — CBC
HCT: 37.3 % — ABNORMAL LOW (ref 40.0–52.0)
HGB: 12.6 g/dL — ABNORMAL LOW (ref 13.0–18.0)
MCH: 30.7 pg (ref 26.0–34.0)
MCHC: 33.8 g/dL (ref 32.0–36.0)
MCV: 91 fL (ref 80–100)
RBC: 4.12 10*6/uL — ABNORMAL LOW (ref 4.40–5.90)
RDW: 13.7 % (ref 11.5–14.5)
WBC: 12 10*3/uL — ABNORMAL HIGH (ref 3.8–10.6)

## 2012-04-09 LAB — COMPREHENSIVE METABOLIC PANEL
Albumin: 3.2 g/dL — ABNORMAL LOW (ref 3.4–5.0)
Anion Gap: 8 (ref 7–16)
BUN: 14 mg/dL (ref 7–18)
Calcium, Total: 8.4 mg/dL — ABNORMAL LOW (ref 8.5–10.1)
Chloride: 101 mmol/L (ref 98–107)
Creatinine: 0.94 mg/dL (ref 0.60–1.30)
Glucose: 91 mg/dL (ref 65–99)
Potassium: 3.5 mmol/L (ref 3.5–5.1)
SGOT(AST): 28 U/L (ref 15–37)
Sodium: 136 mmol/L (ref 136–145)
Total Protein: 7.6 g/dL (ref 6.4–8.2)

## 2012-04-09 LAB — TROPONIN I: Troponin-I: 0.02 ng/mL

## 2012-04-17 ENCOUNTER — Emergency Department: Payer: Self-pay | Admitting: Emergency Medicine

## 2012-05-07 ENCOUNTER — Emergency Department: Payer: Self-pay | Admitting: Emergency Medicine

## 2012-06-22 ENCOUNTER — Encounter (HOSPITAL_COMMUNITY): Payer: Self-pay | Admitting: *Deleted

## 2012-06-22 ENCOUNTER — Emergency Department (HOSPITAL_COMMUNITY): Payer: Medicare Other

## 2012-06-22 ENCOUNTER — Emergency Department (HOSPITAL_COMMUNITY)
Admission: EM | Admit: 2012-06-22 | Discharge: 2012-06-22 | Disposition: A | Discharge status: Home / Self Care | Payer: Medicare Other | Attending: Emergency Medicine | Admitting: Emergency Medicine

## 2012-06-22 DIAGNOSIS — J189 Pneumonia, unspecified organism: Secondary | ICD-10-CM | POA: Insufficient documentation

## 2012-06-22 DIAGNOSIS — R42 Dizziness and giddiness: Secondary | ICD-10-CM | POA: Insufficient documentation

## 2012-06-22 DIAGNOSIS — R55 Syncope and collapse: Secondary | ICD-10-CM | POA: Insufficient documentation

## 2012-06-22 DIAGNOSIS — I10 Essential (primary) hypertension: Secondary | ICD-10-CM | POA: Insufficient documentation

## 2012-06-22 LAB — CBC WITH DIFFERENTIAL/PLATELET
Basophils Absolute: 0 10*3/uL (ref 0.0–0.1)
Eosinophils Absolute: 0.1 10*3/uL (ref 0.0–0.7)
Eosinophils Relative: 1 % (ref 0–5)
MCH: 30.1 pg (ref 26.0–34.0)
MCV: 88.8 fL (ref 78.0–100.0)
Platelets: 217 10*3/uL (ref 150–400)
RDW: 13.4 % (ref 11.5–15.5)

## 2012-06-22 LAB — URINE MICROSCOPIC-ADD ON

## 2012-06-22 LAB — URINALYSIS, ROUTINE W REFLEX MICROSCOPIC
Glucose, UA: NEGATIVE mg/dL
Ketones, ur: NEGATIVE mg/dL
Protein, ur: NEGATIVE mg/dL

## 2012-06-22 LAB — BASIC METABOLIC PANEL
BUN: 27 mg/dL — ABNORMAL HIGH (ref 6–23)
CO2: 24 mEq/L (ref 19–32)
Calcium: 9.1 mg/dL (ref 8.4–10.5)
Creatinine, Ser: 0.93 mg/dL (ref 0.50–1.35)

## 2012-06-22 LAB — TROPONIN I: Troponin I: <0.3 ng/mL (ref <0.30)

## 2012-06-22 MED ORDER — MOXIFLOXACIN HCL 400 MG PO TABS: 400.0000 mg | ORAL_TABLET | Freq: Every day | ORAL | Status: AC

## 2012-06-22 NOTE — ED Provider Notes (Signed)
History     CSN: [id number]  Arrival date & time 06/22/12  1657   First MD Initiated Contact with Patient 06/22/12 1707      Chief Complaint  Patient presents with  . Near Syncope  . Dizziness    (Consider location/radiation/quality/duration/timing/severity/associated sxs/prior treatment) The history is provided by the patient.   patient states that he has had difficulty with weakness in the legs for the last 30 years. He states it is recently gotten worse. He states when he had previous back surgery he was told to get worse every year. He states it is recently gotten worse. He states he's had frequent falls. States he does not get dizzy his legs just give out. He states it is normal his right leg that will give out. No headache. No fevers. He states he is seeing other doctors for this. No cough. He states he is a disabled wife at home. He states his been eating well. Patient does not want placement, but states he is having difficulty at home with his wife. His primary care doctors in [place].  Past Medical History  Diagnosis Date  . Hypertension   . PVD (peripheral vascular disease)     History reviewed. No pertinent past surgical history.  No family history on file.  History  Substance Use Topics  . Smoking status: Not on file  . Smokeless tobacco: Not on file  . Alcohol Use: Not on file      Review of Systems  Constitutional: Positive for fatigue. Negative for activity change and appetite change.  HENT: Negative for neck stiffness.   Eyes: Negative for pain.  Respiratory: Negative for chest tightness and shortness of breath.   Cardiovascular: Negative for chest pain and leg swelling.  Gastrointestinal: Negative for nausea, vomiting, abdominal pain and diarrhea.  Genitourinary: Negative for flank pain.  Musculoskeletal: Positive for back pain.  Skin: Negative for rash.  Neurological: Positive for weakness. Negative for numbness and headaches.    Psychiatric/Behavioral: Negative for behavioral problems.    Allergies  Penicillins  Home Medications   Current Outpatient Rx  Name Route Sig Dispense Refill  . MOXIFLOXACIN HCL 400 MG PO TABS Oral Take 1 tablet (400 mg total) by mouth daily. 7 tablet 0    BP 116/55  Pulse 89  Temp 98.5 F (36.9 C) (Oral)  Resp 20  SpO2 98%  Physical Exam  Nursing note and vitals reviewed. Constitutional: He is oriented to person, place, and time. He appears well-developed and well-nourished.  HENT:  Head: Normocephalic and atraumatic.  Eyes: EOM are normal. Pupils are equal, round, and reactive to light.  Neck: Normal range of motion. Neck supple.  Cardiovascular: Normal rate, regular rhythm and normal heart sounds.   No murmur heard. Pulmonary/Chest: Effort normal and breath sounds normal.  Abdominal: Soft. Bowel sounds are normal. He exhibits no distension and no mass. There is no tenderness. There is no rebound and no guarding.  Musculoskeletal: Normal range of motion. He exhibits no edema.       Mild lumbar tenderness.  Neurological: He is alert and oriented to person, place, and time. No cranial nerve deficit.       Chronic weakness in bilateral lower extremities. He has braces for foot drop. Mild lumbar tenderness.  Skin: Skin is warm and dry.  Psychiatric: He has a normal mood and affect.    ED Course  Procedures (including critical care time)  Labs Reviewed  CBC WITH DIFFERENTIAL - Abnormal; Notable for the  following:    WBC 10.6 (*)     Hemoglobin 12.9 (*)     HCT 38.0 (*)     All other components within normal limits  BASIC METABOLIC PANEL - Abnormal; Notable for the following:    BUN 27 (*)     GFR calc non Af Amer 77 (*)     GFR calc Af Amer 90 (*)     All other components within normal limits  TROPONIN I  URINALYSIS, ROUTINE W REFLEX MICROSCOPIC   Dg Chest 2 View  06/22/2012  *RADIOLOGY REPORT*  Clinical Data: Weakness and low back pain.  Near-syncope.  The   CHEST - 2 VIEW  Comparison: CT chest 12/13/2011.  Findings: New right lower lobe airspace disease is present, most compatible with pneumonia.  Emphysematous changes are again seen. The patient is status post median sternotomy for CABG.  No other focal airspace disease is evident.  Advanced degenerative changes are again noted within the thoracic spine.  IMPRESSION:  1.  No new focal airspace disease in the right lower lobe is most concerning for pneumonia.  The patient did have some nodules in the right lower lobe previously.  Recommend follow-up chest x-ray after the resolution of acute symptoms to assure clearing of this area. 2.  Emphysema. 3.  Status post CABG.  Original Report Authenticated By: Jamesetta Orleans. MATTERN, M.D.   Dg Lumbar Spine Complete  06/22/2012  *RADIOLOGY REPORT*  Clinical Data: Dizziness.  Near-syncope.  LUMBAR SPINE - COMPLETE 4+ VIEW  Comparison: CT angio of the abdomen.  Findings: Five non-rib bearing lumbar type vertebral bodies are present.  Patient is status post laminectomies at L4 and L5.  There is significant loss of disc height at L4-5, there is significant loss disc height at L3-4, L4-5, and L5-S1.  Advanced degenerative disc disease is present at each of these levels.  There is slight degenerative anterolisthesis at L2-3 and L1-2.  Vertebral body heights are stable.  Vascular stents are present within the right iliac system.  No acute fracture or traumatic subluxation is evident.  IMPRESSION:  1.  Stable spondylosis of the lumbar spine. 2.  Status post laminectomies at L4 and L5. 3.  Vascular stents within the right iliac system.  Original Report Authenticated By: Jamesetta Orleans. MATTERN, M.D.     1. CAP (community acquired pneumonia)       MDM  Patient with chronic weakness of his lower extremities. He states his been worsening over 30 years. X-ray is stable. X-ray of the chest shows possible pneumonia. Patient be treated with antibiotics. He's not willing to stay for  admission. He states his wife at home he is to go home to. He'll be discharged home with antibiotics.        Juliet Rude. Rubin Payor, MD 06/22/12 [phone]

## 2012-06-22 NOTE — ED Notes (Signed)
Pt provided with warm blanket and pillow for leg support.

## 2012-06-22 NOTE — ED Notes (Addendum)
Pt transported to bathroom via stedy.  Urine specimen collected.

## 2012-06-22 NOTE — ED Notes (Signed)
Pt reports all episodes of near syncope occur in bathroom while attempting to have a BM. States happens daily. Denies dizziness presently or worsening of symptoms upon standing. Denies n/v/d, palpitations, CP

## 2012-06-22 NOTE — ED Notes (Signed)
Pt is aware that we need to collect urine specimen. Urinal provided.

## 2012-06-22 NOTE — ED Notes (Signed)
Patient transported to X-ray 

## 2012-06-22 NOTE — ED Notes (Signed)
C/o dizziness, near syncope x 3 months. States has been having frequent falls due to dizziness. Denies n/v/d, CP, SOB, palpitations.  Has seen PCP for same

## 2012-06-28 ENCOUNTER — Ambulatory Visit: Payer: Self-pay | Admitting: Family Medicine

## 2012-06-29 ENCOUNTER — Ambulatory Visit: Payer: Self-pay | Admitting: Family Medicine

## 2012-08-14 ENCOUNTER — Ambulatory Visit: Payer: Self-pay | Admitting: Family Medicine

## 2012-08-22 ENCOUNTER — Ambulatory Visit: Payer: Self-pay | Admitting: Family Medicine

## 2012-09-06 ENCOUNTER — Ambulatory Visit: Payer: Self-pay | Admitting: Family Medicine

## 2012-09-08 ENCOUNTER — Ambulatory Visit: Payer: Self-pay | Admitting: Hematology and Oncology

## 2012-09-08 LAB — COMPREHENSIVE METABOLIC PANEL
Anion Gap: 10 (ref 7–16)
BUN: 13 mg/dL (ref 7–18)
Calcium, Total: 9.2 mg/dL (ref 8.5–10.1)
Chloride: 105 mmol/L (ref 98–107)
Co2: 25 mmol/L (ref 21–32)
EGFR (Non-African Amer.): >60
Glucose: 82 mg/dL (ref 65–99)
Osmolality: 279 (ref 275–301)
Potassium: 4.2 mmol/L (ref 3.5–5.1)
SGOT(AST): 26 U/L (ref 15–37)
SGPT (ALT): 16 U/L (ref 12–78)
Sodium: 140 mmol/L (ref 136–145)
Total Protein: 7.7 g/dL (ref 6.4–8.2)

## 2012-09-08 LAB — CBC CANCER CENTER
Basophil %: 1 %
Eosinophil #: 0.2 x10 3/mm (ref 0.0–0.7)
Eosinophil %: 2.7 %
HCT: 40 % (ref 40.0–52.0)
Lymphocyte %: 23.1 %
MCHC: 32 g/dL (ref 32.0–36.0)
MCV: 92 fL (ref 80–100)
Monocyte %: 6.3 %
Neutrophil #: 5.5 x10 3/mm (ref 1.4–6.5)
Neutrophil %: 66.9 %
RBC: 4.37 10*6/uL — ABNORMAL LOW (ref 4.40–5.90)
WBC: 8.2 x10 3/mm (ref 3.8–10.6)

## 2012-09-14 ENCOUNTER — Ambulatory Visit: Payer: Self-pay | Admitting: Hematology and Oncology

## 2012-09-15 LAB — PATHOLOGY REPORT

## 2012-09-18 LAB — CBC CANCER CENTER
Basophil %: 1 %
Eosinophil %: 2.5 %
HGB: 12.5 g/dL — ABNORMAL LOW (ref 13.0–18.0)
Lymphocyte #: 1.4 x10 3/mm (ref 1.0–3.6)
Lymphocyte %: 18.4 %
MCH: 30.5 pg (ref 26.0–34.0)
MCV: 91 fL (ref 80–100)
Monocyte #: 0.6 x10 3/mm (ref 0.2–1.0)
Monocyte %: 8 %
Neutrophil %: 70.1 %
Platelet: 166 x10 3/mm (ref 150–440)
RBC: 4.1 10*6/uL — ABNORMAL LOW (ref 4.40–5.90)
WBC: 7.9 x10 3/mm (ref 3.8–10.6)

## 2012-09-18 LAB — COMPREHENSIVE METABOLIC PANEL
Alkaline Phosphatase: 118 U/L (ref 50–136)
Anion Gap: 6 — ABNORMAL LOW (ref 7–16)
BUN: 12 mg/dL (ref 7–18)
Chloride: 105 mmol/L (ref 98–107)
Co2: 29 mmol/L (ref 21–32)
EGFR (African American): >60
EGFR (Non-African Amer.): >60
Potassium: 4.4 mmol/L (ref 3.5–5.1)
SGOT(AST): 29 U/L (ref 15–37)
SGPT (ALT): 19 U/L (ref 12–78)
Total Protein: 7 g/dL (ref 6.4–8.2)

## 2012-09-26 LAB — COMPREHENSIVE METABOLIC PANEL
Albumin: 2.9 g/dL — ABNORMAL LOW (ref 3.4–5.0)
Alkaline Phosphatase: 97 U/L (ref 50–136)
Anion Gap: 10 (ref 7–16)
BUN: 27 mg/dL — ABNORMAL HIGH (ref 7–18)
Bilirubin,Total: 0.5 mg/dL (ref 0.2–1.0)
Calcium, Total: 8.3 mg/dL — ABNORMAL LOW (ref 8.5–10.1)
Chloride: 106 mmol/L (ref 98–107)
Co2: 24 mmol/L (ref 21–32)
Creatinine: 0.94 mg/dL (ref 0.60–1.30)
EGFR (Non-African Amer.): >60
Osmolality: 283 (ref 275–301)
Potassium: 4.3 mmol/L (ref 3.5–5.1)
Sodium: 140 mmol/L (ref 136–145)
Total Protein: 6.5 g/dL (ref 6.4–8.2)

## 2012-09-26 LAB — CBC CANCER CENTER
Basophil %: 1.4 %
Eosinophil %: 2.8 %
HGB: 11 g/dL — ABNORMAL LOW (ref 13.0–18.0)
Lymphocyte #: 1.1 x10 3/mm (ref 1.0–3.6)
Lymphocyte %: 27.9 %
MCH: 29.3 pg (ref 26.0–34.0)
MCV: 92 fL (ref 80–100)
Monocyte %: 12.6 %
Neutrophil %: 55.3 %
Platelet: 92 x10 3/mm — ABNORMAL LOW (ref 150–440)
RBC: 3.76 10*6/uL — ABNORMAL LOW (ref 4.40–5.90)
WBC: 4 x10 3/mm (ref 3.8–10.6)

## 2012-10-06 ENCOUNTER — Ambulatory Visit: Payer: Self-pay | Admitting: Hematology and Oncology

## 2012-10-09 LAB — CBC CANCER CENTER
Eosinophil #: 0 x10 3/mm (ref 0.0–0.7)
Eosinophil %: 0.1 %
HCT: 34.4 % — ABNORMAL LOW (ref 40.0–52.0)
MCH: 30.4 pg (ref 26.0–34.0)
MCV: 92 fL (ref 80–100)
Neutrophil #: 13.6 x10 3/mm — ABNORMAL HIGH (ref 1.4–6.5)
Platelet: 282 x10 3/mm (ref 150–440)
RBC: 3.76 10*6/uL — ABNORMAL LOW (ref 4.40–5.90)
RDW: 13.8 % (ref 11.5–14.5)

## 2012-10-09 LAB — BASIC METABOLIC PANEL
BUN: 16 mg/dL (ref 7–18)
Calcium, Total: 8.9 mg/dL (ref 8.5–10.1)
Chloride: 104 mmol/L (ref 98–107)
EGFR (Non-African Amer.): >60
Glucose: 73 mg/dL (ref 65–99)
Potassium: 4.4 mmol/L (ref 3.5–5.1)
Sodium: 140 mmol/L (ref 136–145)

## 2012-10-30 LAB — COMPREHENSIVE METABOLIC PANEL
Albumin: 3.1 g/dL — ABNORMAL LOW (ref 3.4–5.0)
Anion Gap: 9 (ref 7–16)
BUN: 16 mg/dL (ref 7–18)
Bilirubin,Total: 0.2 mg/dL (ref 0.2–1.0)
Calcium, Total: 8.8 mg/dL (ref 8.5–10.1)
Chloride: 105 mmol/L (ref 98–107)
Co2: 28 mmol/L (ref 21–32)
Creatinine: 0.99 mg/dL (ref 0.60–1.30)
EGFR (African American): >60
EGFR (Non-African Amer.): >60
Glucose: 82 mg/dL (ref 65–99)
Osmolality: 283 (ref 275–301)
Potassium: 4.1 mmol/L (ref 3.5–5.1)
SGOT(AST): 17 U/L (ref 15–37)
Sodium: 142 mmol/L (ref 136–145)

## 2012-10-30 LAB — CBC CANCER CENTER
Basophil %: 0.7 %
Eosinophil %: 0.1 %
HCT: 32.6 % — ABNORMAL LOW (ref 40.0–52.0)
HGB: 10.9 g/dL — ABNORMAL LOW (ref 13.0–18.0)
MCH: 31.1 pg (ref 26.0–34.0)
MCV: 93 fL (ref 80–100)
Monocyte #: 0.7 x10 3/mm (ref 0.2–1.0)
Monocyte %: 4.7 %
Neutrophil #: 13 x10 3/mm — ABNORMAL HIGH (ref 1.4–6.5)
Neutrophil %: 83 %
Platelet: 146 x10 3/mm — ABNORMAL LOW (ref 150–440)
RBC: 3.49 10*6/uL — ABNORMAL LOW (ref 4.40–5.90)
WBC: 15.6 x10 3/mm — ABNORMAL HIGH (ref 3.8–10.6)

## 2012-11-05 ENCOUNTER — Ambulatory Visit: Payer: Self-pay | Admitting: Hematology and Oncology

## 2012-11-20 LAB — CBC CANCER CENTER
Basophil #: 0.1 x10 3/mm (ref 0.0–0.1)
Basophil %: 0.7 %
Eosinophil #: 0 x10 3/mm (ref 0.0–0.7)
Eosinophil %: 0.1 %
HGB: 10.8 g/dL — ABNORMAL LOW (ref 13.0–18.0)
Lymphocyte %: 11.2 %
MCH: 31.8 pg (ref 26.0–34.0)
MCHC: 34.3 g/dL (ref 32.0–36.0)
Monocyte #: 0.4 x10 3/mm (ref 0.2–1.0)
Neutrophil %: 84.6 %
Platelet: 168 x10 3/mm (ref 150–440)
RDW: 18.4 % — ABNORMAL HIGH (ref 11.5–14.5)

## 2012-11-20 LAB — COMPREHENSIVE METABOLIC PANEL
Albumin: 3 g/dL — ABNORMAL LOW (ref 3.4–5.0)
Alkaline Phosphatase: 117 U/L (ref 50–136)
BUN: 13 mg/dL (ref 7–18)
Bilirubin,Total: 0.3 mg/dL (ref 0.2–1.0)
Calcium, Total: 8.3 mg/dL — ABNORMAL LOW (ref 8.5–10.1)
Co2: 27 mmol/L (ref 21–32)
EGFR (Non-African Amer.): >60
Glucose: 98 mg/dL (ref 65–99)
Osmolality: 283 (ref 275–301)
SGOT(AST): 17 U/L (ref 15–37)
SGPT (ALT): 10 U/L — ABNORMAL LOW (ref 12–78)
Sodium: 142 mmol/L (ref 136–145)

## 2012-12-06 ENCOUNTER — Ambulatory Visit: Payer: Self-pay | Admitting: Hematology and Oncology

## 2012-12-07 LAB — COMPREHENSIVE METABOLIC PANEL
Albumin: 3.1 g/dL — ABNORMAL LOW (ref 3.4–5.0)
Alkaline Phosphatase: 122 U/L (ref 50–136)
Anion Gap: 5 — ABNORMAL LOW (ref 7–16)
BUN: 15 mg/dL (ref 7–18)
Bilirubin,Total: 0.2 mg/dL (ref 0.2–1.0)
Chloride: 106 mmol/L (ref 98–107)
Co2: 29 mmol/L (ref 21–32)
Creatinine: 0.87 mg/dL (ref 0.60–1.30)
EGFR (African American): >60
EGFR (Non-African Amer.): >60
SGPT (ALT): 14 U/L (ref 12–78)
Total Protein: 7.1 g/dL (ref 6.4–8.2)

## 2012-12-07 LAB — CBC CANCER CENTER
Basophil %: 0.3 %
Eosinophil #: 0.1 x10 3/mm (ref 0.0–0.7)
Eosinophil %: 0.4 %
HCT: 27.1 % — ABNORMAL LOW (ref 40.0–52.0)
HGB: 9.1 g/dL — ABNORMAL LOW (ref 13.0–18.0)
MCH: 32.2 pg (ref 26.0–34.0)
MCHC: 33.6 g/dL (ref 32.0–36.0)
MCV: 96 fL (ref 80–100)
Monocyte #: 0.9 x10 3/mm (ref 0.2–1.0)
Monocyte %: 5.5 %
Neutrophil #: 13.3 x10 3/mm — ABNORMAL HIGH (ref 1.4–6.5)
Platelet: 101 x10 3/mm — ABNORMAL LOW (ref 150–440)
RDW: 19.4 % — ABNORMAL HIGH (ref 11.5–14.5)

## 2012-12-25 LAB — COMPREHENSIVE METABOLIC PANEL
Albumin: 3.1 g/dL — ABNORMAL LOW (ref 3.4–5.0)
Alkaline Phosphatase: 97 U/L (ref 50–136)
Anion Gap: 8 (ref 7–16)
BUN: 21 mg/dL — ABNORMAL HIGH (ref 7–18)
Bilirubin,Total: 0.4 mg/dL (ref 0.2–1.0)
Calcium, Total: 8.5 mg/dL (ref 8.5–10.1)
Chloride: 108 mmol/L — ABNORMAL HIGH (ref 98–107)
Co2: 28 mmol/L (ref 21–32)
Creatinine: 0.94 mg/dL (ref 0.60–1.30)
EGFR (African American): >60
EGFR (Non-African Amer.): >60
Glucose: 118 mg/dL — ABNORMAL HIGH (ref 65–99)
Osmolality: 291 (ref 275–301)
Potassium: 4 mmol/L (ref 3.5–5.1)
SGOT(AST): 21 U/L (ref 15–37)
SGPT (ALT): 14 U/L (ref 12–78)
Sodium: 144 mmol/L (ref 136–145)
Total Protein: 6.9 g/dL (ref 6.4–8.2)

## 2012-12-25 LAB — CBC CANCER CENTER
Basophil #: 0.2 x10 3/mm — ABNORMAL HIGH (ref 0.0–0.1)
Basophil %: 1.7 %
Eosinophil #: 0.2 x10 3/mm (ref 0.0–0.7)
HGB: 9.7 g/dL — ABNORMAL LOW (ref 13.0–18.0)
Lymphocyte %: 10.8 %
MCH: 33.3 pg (ref 26.0–34.0)
MCHC: 33.8 g/dL (ref 32.0–36.0)
Monocyte %: 5.3 %
Neutrophil #: 7.3 x10 3/mm — ABNORMAL HIGH (ref 1.4–6.5)
Platelet: 175 x10 3/mm (ref 150–440)
RDW: 17.2 % — ABNORMAL HIGH (ref 11.5–14.5)

## 2013-01-06 ENCOUNTER — Ambulatory Visit: Payer: Self-pay | Admitting: Hematology and Oncology

## 2013-01-11 LAB — CBC CANCER CENTER
Basophil %: 0.8 %
Eosinophil #: 0.1 x10 3/mm (ref 0.0–0.7)
Lymphocyte %: 20.1 %
MCH: 32.6 pg (ref 26.0–34.0)
MCHC: 33.4 g/dL (ref 32.0–36.0)
MCV: 98 fL (ref 80–100)
Monocyte #: 0.4 x10 3/mm (ref 0.2–1.0)
Monocyte %: 6 %
Neutrophil #: 5.2 x10 3/mm (ref 1.4–6.5)
Neutrophil %: 71.8 %

## 2013-01-16 LAB — CBC CANCER CENTER
Basophil #: 0.1 x10 3/mm (ref 0.0–0.1)
Eosinophil #: 0.2 x10 3/mm (ref 0.0–0.7)
HCT: 34.5 % — ABNORMAL LOW (ref 40.0–52.0)
HGB: 11.6 g/dL — ABNORMAL LOW (ref 13.0–18.0)
Lymphocyte %: 18.6 %
MCV: 99 fL (ref 80–100)
Monocyte #: 0.4 x10 3/mm (ref 0.2–1.0)
Monocyte %: 4.4 %
Neutrophil #: 5.9 x10 3/mm (ref 1.4–6.5)
Neutrophil %: 73.9 %
Platelet: 152 x10 3/mm (ref 150–440)
RBC: 3.5 10*6/uL — ABNORMAL LOW (ref 4.40–5.90)
RDW: 13.3 % (ref 11.5–14.5)

## 2013-01-21 IMAGING — CT CT CHEST W/O CM
1 of 2 series · 14 of 32 positions shown, 18 images · non-contrast
Comparison: none

REASON FOR EXAM: Restaging Lung CA
COMMENTS:

PROCEDURE:     KCT - KCT CHEST WITHOUT CONTRAST  - October 24, 2012 [DATE]
RESULT:
TECHNIQUE: Helical noncontrasted 3 mm sections were obtained from the
thoracic inlet through the lung bases.

[Series 2: chest w/o 3.0 i31f 2 · axial · non-contrast · 0.83mm/px · z∈[-616,-328]mm · 14 of 114 slices shown, 18 images]
[im 9/114  mediastinal]
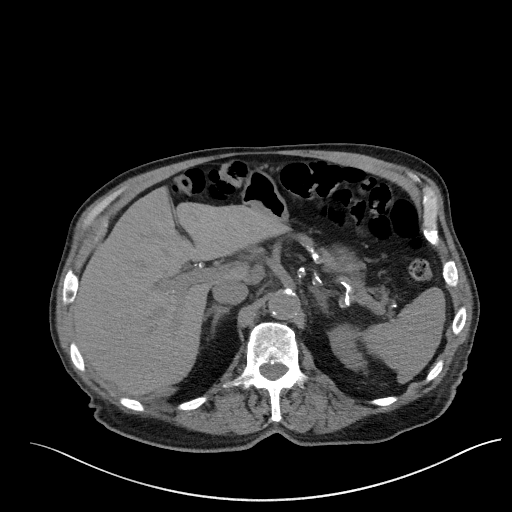
[im 9/114  lung]
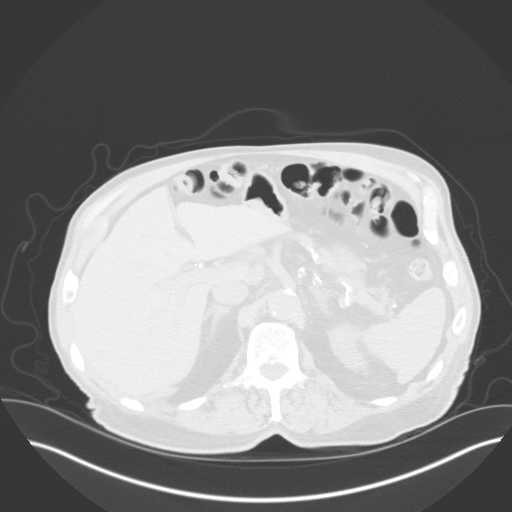
[im 18/114  lung]
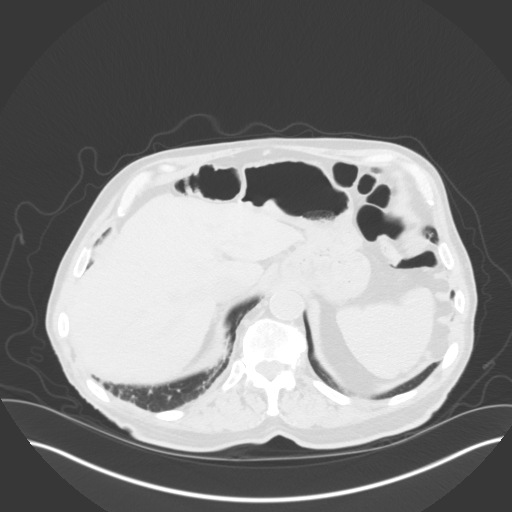
[im 27/114  lung]
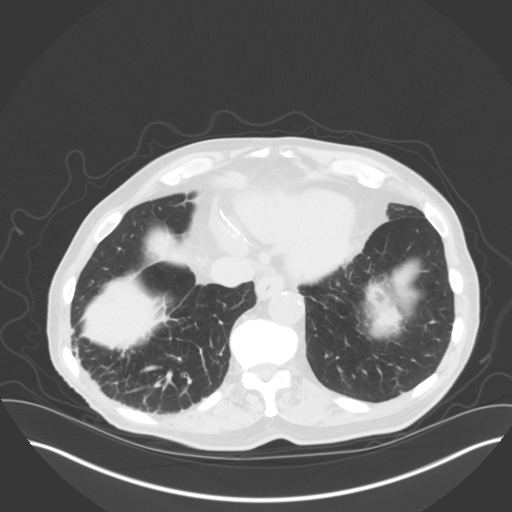
[im 35/114  lung]
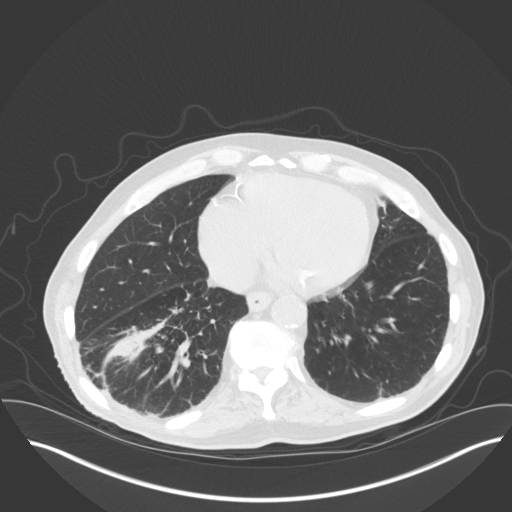
[im 44/114  mediastinal]
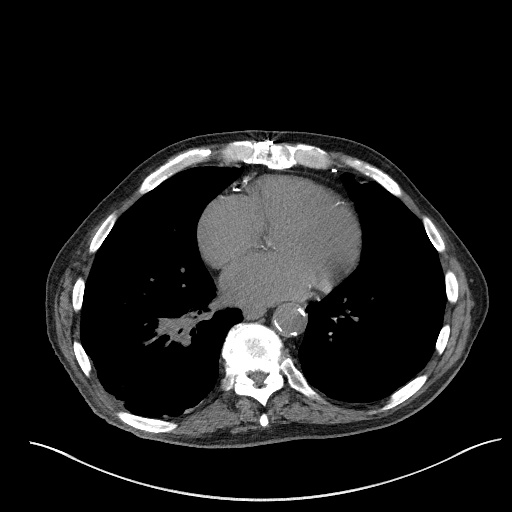
[im 44/114  lung]
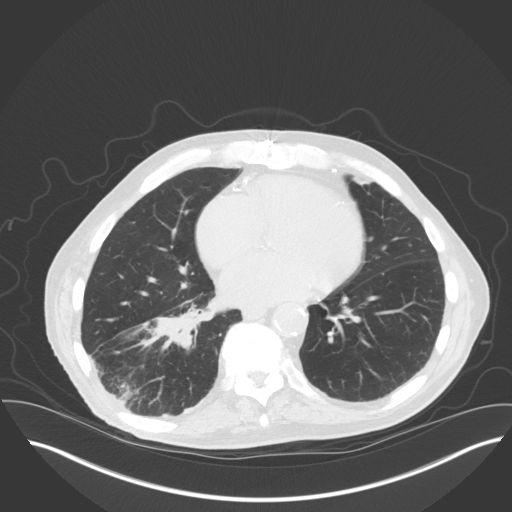
[im 53/114  lung]
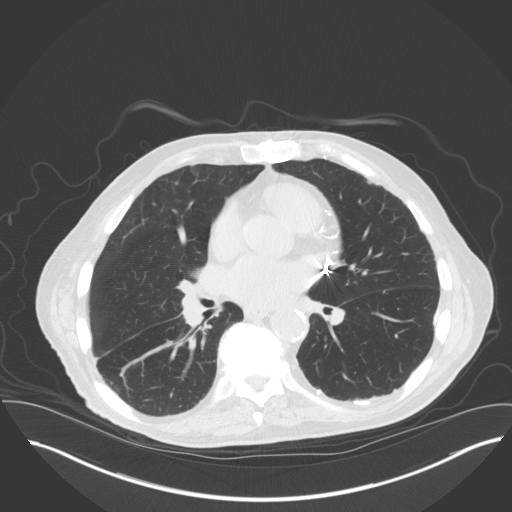
[im 55/114  lung]
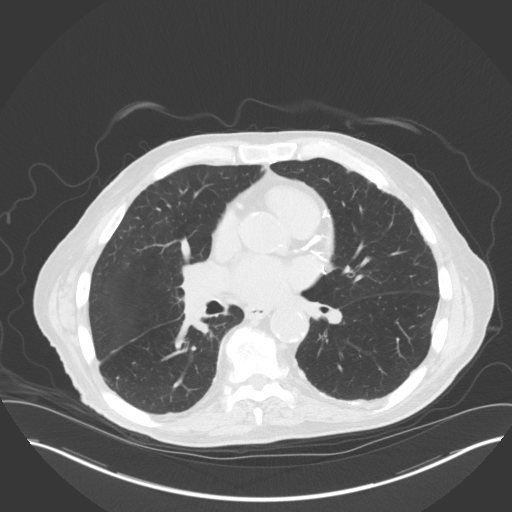
[im 57/114  lung]
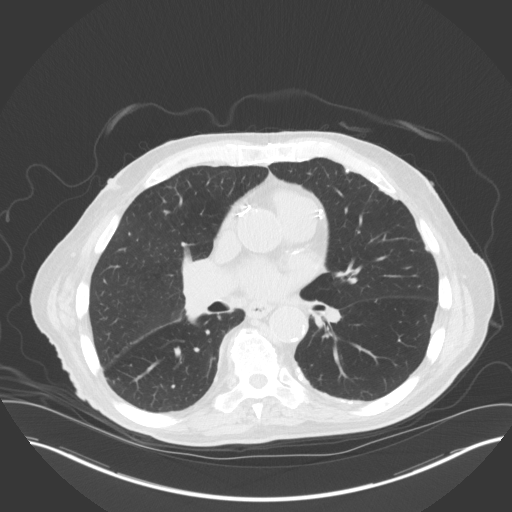
[im 61/114  mediastinal]
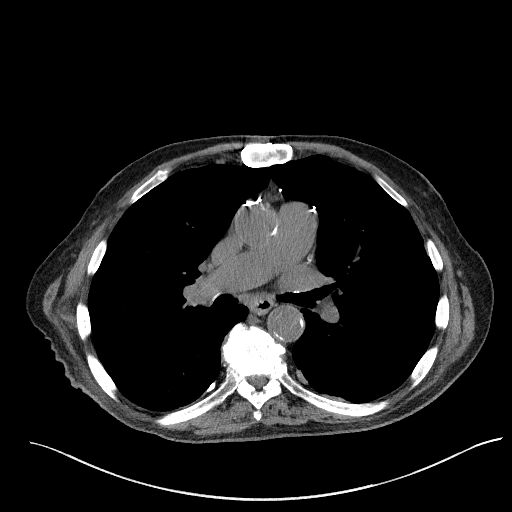
[im 61/114  lung]
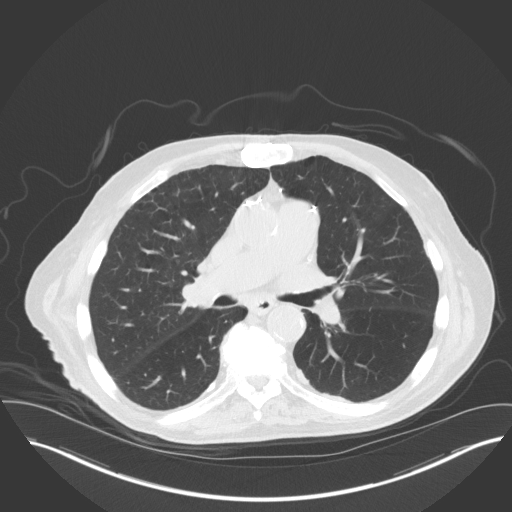
[im 70/114  lung]
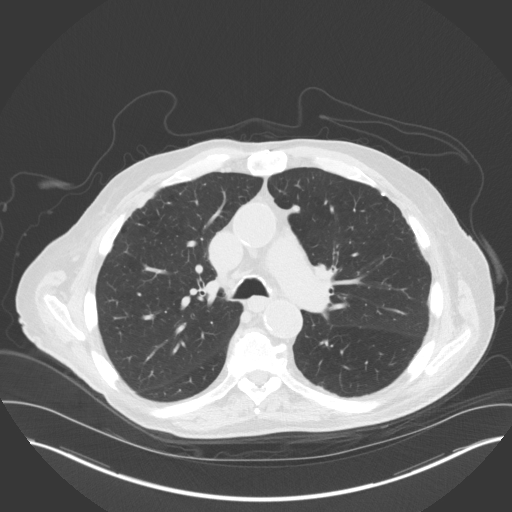
[im 79/114  lung]
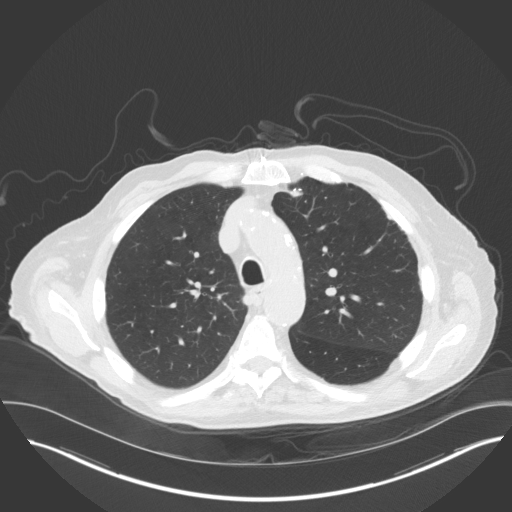
[im 87/114  lung]
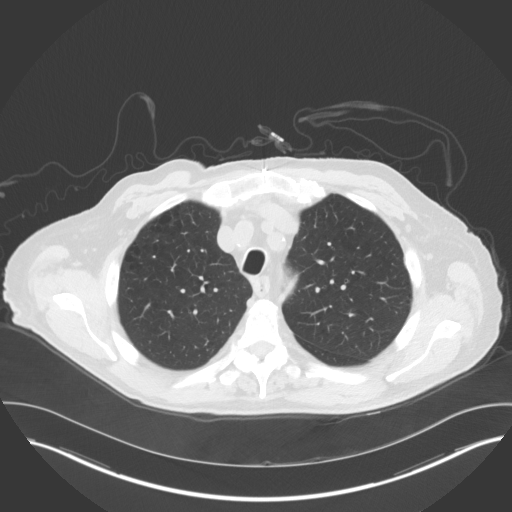
[im 96/114  mediastinal]
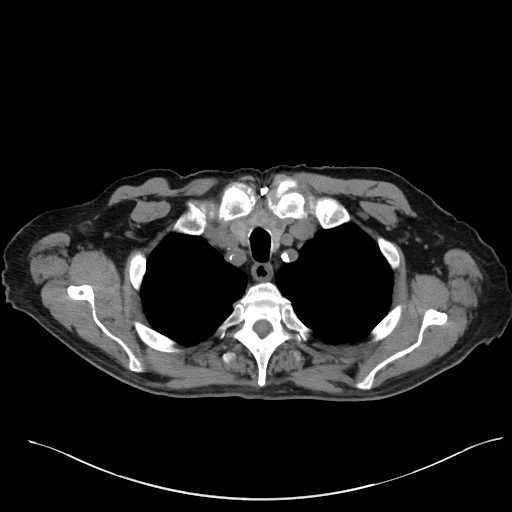
[im 96/114  lung]
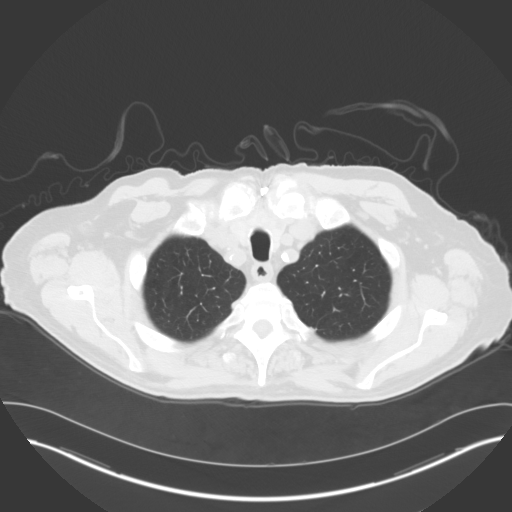
[im 105/114  lung]
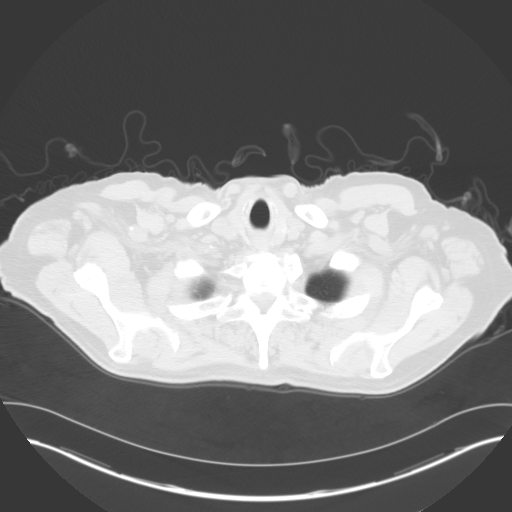

[14 of 32 positions shown; findings below may reference images not displayed]

FINDINGS: The mediastinum and hilar regions and structures demonstrates
multichamber cardiac enlargement. Subcentimeter right precarinal lymph node
is identified. No further mediastinal masses nor adenopathy is identified.
An ill-defined amorphous area of increased mass-like density extends from
the right hilar region to the periphery of the lung. When compared to the
previous study this area has markedly decreased in size grossly measuring
5.79 x 3.82 cm in maximal orthogonal dimensions. There is an area distally
of pleural thickening which has decreased in size. Measurements were
obtained on image #76 of the lung windows. Calcified pleural plaques are
identified within the paraspinal regions. No new pulmonary masses or nodules
are identified.

The visualized upper abdominal viscera demonstrates decreased conspicuity of
the low attenuating mass within the right lobe of the liver on image #110. A
small nodular focus which is stable projects within the left adrenal
measuring approximately 1.86 cm. Stable cyst is identified within the right
kidney.
IMPRESSION: 1. Decreased size of the known right lower lobe mass as well as the mass in
the right lobe of the liver likely representing metastatic disease.
2. Stable left adrenal. Continued surveillance evaluation recommended.

## 2013-01-23 LAB — CBC CANCER CENTER
Basophil #: 0.1 x10 3/mm (ref 0.0–0.1)
Basophil %: 0.9 %
Eosinophil #: 0.2 x10 3/mm (ref 0.0–0.7)
Eosinophil %: 2.5 %
HCT: 35 % — ABNORMAL LOW (ref 40.0–52.0)
HGB: 11.9 g/dL — ABNORMAL LOW (ref 13.0–18.0)
Lymphocyte #: 1.3 x10 3/mm (ref 1.0–3.6)
Lymphocyte %: 19.9 %
MCH: 33.1 pg (ref 26.0–34.0)
MCHC: 34.2 g/dL (ref 32.0–36.0)
MCV: 97 fL (ref 80–100)
Monocyte #: 0.4 x10 3/mm (ref 0.2–1.0)
Monocyte %: 5.8 %
Neutrophil #: 4.6 x10 3/mm (ref 1.4–6.5)
Neutrophil %: 70.9 %
Platelet: 144 x10 3/mm — ABNORMAL LOW (ref 150–440)
RBC: 3.61 10*6/uL — ABNORMAL LOW (ref 4.40–5.90)
RDW: 12.9 % (ref 11.5–14.5)
WBC: 6.5 x10 3/mm (ref 3.8–10.6)

## 2013-01-23 LAB — COMPREHENSIVE METABOLIC PANEL
Albumin: 3.5 g/dL (ref 3.4–5.0)
Alkaline Phosphatase: 100 U/L (ref 50–136)
Anion Gap: 8 (ref 7–16)
BUN: 14 mg/dL (ref 7–18)
Bilirubin,Total: 0.3 mg/dL (ref 0.2–1.0)
Calcium, Total: 8.7 mg/dL (ref 8.5–10.1)
Chloride: 103 mmol/L (ref 98–107)
Co2: 30 mmol/L (ref 21–32)
Creatinine: 0.89 mg/dL (ref 0.60–1.30)
EGFR (African American): >60
EGFR (Non-African Amer.): >60
Glucose: 95 mg/dL (ref 65–99)
Osmolality: 282 (ref 275–301)
Potassium: 4.1 mmol/L (ref 3.5–5.1)
SGOT(AST): 17 U/L (ref 15–37)
SGPT (ALT): 13 U/L (ref 12–78)
Sodium: 141 mmol/L (ref 136–145)
Total Protein: 7.6 g/dL (ref 6.4–8.2)

## 2013-01-29 ENCOUNTER — Encounter: Payer: Self-pay | Admitting: *Deleted

## 2013-01-29 DIAGNOSIS — C3490 Malignant neoplasm of unspecified part of unspecified bronchus or lung: Secondary | ICD-10-CM

## 2013-01-29 DIAGNOSIS — I25810 Atherosclerosis of coronary artery bypass graft(s) without angina pectoris: Secondary | ICD-10-CM

## 2013-01-29 DIAGNOSIS — E785 Hyperlipidemia, unspecified: Secondary | ICD-10-CM | POA: Insufficient documentation

## 2013-01-29 DIAGNOSIS — I10 Essential (primary) hypertension: Secondary | ICD-10-CM

## 2013-01-29 DIAGNOSIS — Z951 Presence of aortocoronary bypass graft: Secondary | ICD-10-CM | POA: Insufficient documentation

## 2013-01-29 DIAGNOSIS — C229 Malignant neoplasm of liver, not specified as primary or secondary: Secondary | ICD-10-CM | POA: Insufficient documentation

## 2013-01-29 DIAGNOSIS — I723 Aneurysm of iliac artery: Secondary | ICD-10-CM

## 2013-01-29 HISTORY — DX: Malignant neoplasm of unspecified part of unspecified bronchus or lung: C34.90

## 2013-01-29 HISTORY — DX: Essential (primary) hypertension: I10

## 2013-01-29 HISTORY — DX: Aneurysm of iliac artery: I72.3

## 2013-02-03 ENCOUNTER — Ambulatory Visit: Payer: Self-pay | Admitting: Hematology and Oncology

## 2013-02-19 LAB — COMPREHENSIVE METABOLIC PANEL
Albumin: 3.1 g/dL — ABNORMAL LOW (ref 3.4–5.0)
Alkaline Phosphatase: 91 U/L (ref 50–136)
Bilirubin,Total: 0.2 mg/dL (ref 0.2–1.0)
Chloride: 105 mmol/L (ref 98–107)
Creatinine: 0.94 mg/dL (ref 0.60–1.30)
EGFR (Non-African Amer.): >60
Potassium: 3.9 mmol/L (ref 3.5–5.1)
SGOT(AST): 23 U/L (ref 15–37)
Sodium: 142 mmol/L (ref 136–145)
Total Protein: 6.8 g/dL (ref 6.4–8.2)

## 2013-02-19 LAB — CBC CANCER CENTER
Basophil #: 0.1 x10 3/mm (ref 0.0–0.1)
Basophil %: 0.7 %
Eosinophil #: 0.3 x10 3/mm (ref 0.0–0.7)
Eosinophil %: 3.3 %
HGB: 12.3 g/dL — ABNORMAL LOW (ref 13.0–18.0)
Lymphocyte #: 1.3 x10 3/mm (ref 1.0–3.6)
Lymphocyte %: 16.9 %
MCH: 31.3 pg (ref 26.0–34.0)
MCHC: 33 g/dL (ref 32.0–36.0)
Monocyte %: 8.3 %
Neutrophil #: 5.5 x10 3/mm (ref 1.4–6.5)
Neutrophil %: 70.8 %
Platelet: 140 x10 3/mm — ABNORMAL LOW (ref 150–440)
RBC: 3.91 10*6/uL — ABNORMAL LOW (ref 4.40–5.90)

## 2013-03-06 ENCOUNTER — Ambulatory Visit: Payer: Self-pay | Admitting: Hematology and Oncology

## 2013-04-05 ENCOUNTER — Ambulatory Visit: Payer: Self-pay | Admitting: Hematology and Oncology

## 2013-05-08 ENCOUNTER — Encounter (HOSPITAL_COMMUNITY): Payer: Self-pay | Admitting: Cardiology

## 2013-05-08 ENCOUNTER — Encounter: Payer: Self-pay | Admitting: Cardiology

## 2013-05-08 ENCOUNTER — Ambulatory Visit (INDEPENDENT_AMBULATORY_CARE_PROVIDER_SITE_OTHER): Payer: Medicare Other | Admitting: Cardiology

## 2013-05-08 VITALS — BP 128/52 | HR 60 | Ht 71.0 in | Wt 150.0 lb

## 2013-05-08 DIAGNOSIS — I25810 Atherosclerosis of coronary artery bypass graft(s) without angina pectoris: Secondary | ICD-10-CM

## 2013-05-08 DIAGNOSIS — I739 Peripheral vascular disease, unspecified: Secondary | ICD-10-CM

## 2013-05-08 DIAGNOSIS — I10 Essential (primary) hypertension: Secondary | ICD-10-CM

## 2013-05-08 DIAGNOSIS — R0609 Other forms of dyspnea: Secondary | ICD-10-CM

## 2013-05-08 NOTE — Assessment & Plan Note (Signed)
CABG was 17 years ago, now with DOE will check lexiscan myoview.

## 2013-05-08 NOTE — Assessment & Plan Note (Signed)
Stable off most meds.

## 2013-05-08 NOTE — Patient Instructions (Addendum)
We are scheduling [name] to make sure your bypass grafts are ok and not causing your shortness of breath.  We will have you follow up with Dr. Allyson Sabal for results of test.

## 2013-05-08 NOTE — Progress Notes (Signed)
[facility]  05/08/2013   PCP: Bosie Clos, MD   Chief Complaint  Patient presents with  . rov 6 months    not feeling well, weak, sob, lightheaded    Primary Cardiologist:Dr. Erlene Quan  HPI: 79 year old, thin-appearing, married Caucasian male with no children accompanied by his wife today followed by Dr. Allyson Sabal. He has a history of CAD status post coronary artery bypass grafting in 1997. He had a nonischemic Myoview July 27, 2011. He denies chest pain but does have shortness of breath on exertion. He has PVOD status post left carotid endarterectomy in the past, as well as right iliac stenting by Dr. Allyson Sabal. He has occluded SFAs bilaterally but really does not ambulate much. 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 They also radiated his brain to prevent metastasis.  He denies any chest pain but he does have dyspnea on exertion.  No shortness of breath at rest only with exertion he is able to sleep at night without shortness of breath.  His oncologist is at [facility].  They have been told that tumors have been getting smaller.      Otherwise today other than feeling very weak and tired he has no specific complaints.  No recent lipid panel that I can find.     Allergies  Allergen Reactions  . Penicillins Anaphylaxis    Current Outpatient Prescriptions  Medication Sig Dispense Refill  . cilostazol (PLETAL) 50 MG tablet Take 50 mg by mouth 2 (two) times daily.      Marland Kitchen FLUoxetine (PROZAC) 40 MG capsule Take 40 mg by mouth daily.      Marland Kitchen gabapentin (NEURONTIN) 400 MG capsule Take 300 mg by mouth at bedtime as needed.       Marland Kitchen HYDROcodone-acetaminophen (NORCO/VICODIN) 5-325 MG per tablet Take 1 tablet by mouth every 4 (four) hours as needed. For pain      . lovastatin  (MEVACOR) 40 MG tablet Take 40 mg by mouth at bedtime.      Marland Kitchen lubiprostone (AMITIZA) 24 MCG capsule Take 24 mcg by mouth 2 (two) times daily with a meal.      . niacin 500 MG tablet Take 500 mg by mouth daily with breakfast.      . nystatin (MYCOSTATIN) 100000 UNIT/ML suspension       . omeprazole (PRILOSEC) 20 MG capsule Take 20 mg by mouth daily.       No current facility-administered medications for this visit.    Past Medical History  Diagnosis Date  . Hypertension   . PVD (peripheral vascular disease)     06/27/06 widley patent subclavian vertebrl arteries. right CIA stent  . CAD (coronary artery disease)   . S/P CABG x 3 1997  . Iliac aneurysm     doppler 07/27/12: 3.1x3.0cm  . Cancer     Lung & liver- Dr. Wendie Simmer  . Malignant neoplasm of pharynx, unspecified   . Other and unspecified hyperlipidemia     Past Surgical History  Procedure Laterality Date  . Coronary artery bypass graft  1997  . Carotid endarterectomy      Left  . Cervical discectomy    . Lumbar disc surgery      JXB:JYNWGNF:AO colds or fevers, weight significantly dropped from 172 to 150, secondary to chemotherapy, radiation  and now no appetite.  He is to see a nutritionist tomorrow.   Skin:no rashes or ulcers HEENT:no blurred vision, no congestion CV:see HPI PUL:see HPI GI:no diarrhea constipation or melena, no indigestion GU:no hematuria, no dysuria MS:no joint pain, no claudication Neuro:no syncope, no lightheadedness Endo:no diabetes, no thyroid disease   PHYSICAL EXAM BP 128/52  Pulse 60  Ht 5\' 11"  (1.803 m)  Wt 150 lb (68.04 kg)  BMI 20.93 kg/m2 General:Pleasant affect, NAD, obviously tired Skin:Warm and dry, brisk capillary refill HEENT:normocephalic, sclera clear, mucus membranes moist Neck:supple, no JVD, no bruits  Heart:S1S2 RRR without murmur, gallup, rub or click Lungs:clear without rales, rhonchi, or wheezes ZOX:WRUE, non tender, + BS, do not palpate liver spleen or  masses Ext:no lower ext edema, 2+ pedal pulses, 2+ radial pulses Neuro:alert and oriented, MAE, follows commands, + facial symmetry  EKG: Sinus rhythm rate of 60 and no acute changes from previous tracings.  ASSESSMENT AND PLAN CAD (coronary artery disease), hx of CABG in 1997, negative nucs since. CABG was 17 years ago, now with DOE will check lexiscan myoview.  Essential hypertension, benign Stable off most meds.  PVD (peripheral vascular disease) Stable.  He will follow with Dr. Gery Pray was the test is complete. Concern also is for low EF but I'll wait and see what the LexiScan shows.  If EF is low he would need a 2-D echo.

## 2013-05-08 NOTE — Assessment & Plan Note (Signed)
Stable

## 2013-05-16 ENCOUNTER — Ambulatory Visit (HOSPITAL_COMMUNITY)
Admission: RE | Admit: 2013-05-16 | Discharge: 2013-05-16 | Disposition: A | Discharge status: Home / Self Care | Payer: Medicare Other | Source: Ambulatory Visit | Attending: Cardiovascular Disease | Admitting: Cardiovascular Disease

## 2013-05-16 DIAGNOSIS — I25810 Atherosclerosis of coronary artery bypass graft(s) without angina pectoris: Secondary | ICD-10-CM

## 2013-05-16 DIAGNOSIS — R42 Dizziness and giddiness: Secondary | ICD-10-CM | POA: Insufficient documentation

## 2013-05-16 DIAGNOSIS — I2510 Atherosclerotic heart disease of native coronary artery without angina pectoris: Secondary | ICD-10-CM | POA: Insufficient documentation

## 2013-05-16 DIAGNOSIS — Z951 Presence of aortocoronary bypass graft: Secondary | ICD-10-CM | POA: Insufficient documentation

## 2013-05-16 DIAGNOSIS — R5381 Other malaise: Secondary | ICD-10-CM | POA: Insufficient documentation

## 2013-05-16 DIAGNOSIS — Z9861 Coronary angioplasty status: Secondary | ICD-10-CM | POA: Insufficient documentation

## 2013-05-16 MED ORDER — REGADENOSON 0.4 MG/5ML IV SOLN
0.4000 mg | Freq: Once | INTRAVENOUS | Status: AC
  Administered 2013-05-16: 0.4 mg via INTRAVENOUS

## 2013-05-16 MED ORDER — TECHNETIUM TC 99M SESTAMIBI GENERIC - CARDIOLITE
10.0000 | Freq: Once | INTRAVENOUS | Status: AC | PRN
  Administered 2013-05-16: 10 via INTRAVENOUS

## 2013-05-16 MED ORDER — TECHNETIUM TC 99M SESTAMIBI GENERIC - CARDIOLITE
30.0000 | Freq: Once | INTRAVENOUS | Status: AC | PRN
  Administered 2013-05-16: 30 via INTRAVENOUS

## 2013-05-16 NOTE — Procedures (Addendum)
[facility] [address] [place] [address] [phone]  Cardiology Nuclear Med Study  CACHE BILLS is a 79 y.o. male     MRN : [id number]     DOB: 02/26/1929  Procedure Date: 05/16/2013  Nuclear Med Background Indication for Stress Test:  Graft Patency History:  CAD;CABG X3--1997;PTCA--11/09/2005 AND 06/27/2006. Cardiac Risk Factors: Family History - CAD, History of Smoking, Hypertension and PVD  Symptoms:  Dizziness, DOE, Fatigue, Light-Headedness and SOB   Nuclear Pre-Procedure Caffeine/Decaff Intake:  7:00pm NPO After: 5:00am   IV Site: R Forearm  IV 0.9% NS with Angio Cath:  22g  Chest Size (in):  38"  IV Started by: Emmit Pomfret, RN  Height: 5\' 11"  (1.803 m)  Cup Size: n/a  BMI:  Body mass index is 20.93 kg/(m^2). Weight:  150 lb (68.04 kg)   Tech Comments:  N/A    Nuclear Med Study 1 or 2 day study: 1 day  Stress Test Type:  Lexiscan  Order Authorizing Provider:  Thurmon Fair, MD   Resting Radionuclide: Technetium 34m Sestamibi  Resting Radionuclide Dose: 10.5 mCi   Stress Radionuclide:  Technetium 75m Sestamibi  Stress Radionuclide Dose: 31.8 mCi           Stress Protocol Rest HR: 48 Stress HR: 77  Rest BP: 140/75 Stress BP: 143/74  Exercise Time (min): n/a METS: n/a   Predicted Max HR: 139 bpm % Max HR: 55.4 bpm Rate Pressure Product: 96295  Dose of Adenosine (mg):  n/a Dose of Lexiscan: 0.4 mg  Dose of Atropine (mg): n/a Dose of Dobutamine: n/a mcg/kg/min (at max HR)  Stress Test Technologist: Esperanza Sheets, CCT Nuclear Technologist: Koren Shiver, CNMT   Rest Procedure:  Myocardial perfusion imaging was performed at rest 45 minutes following the intravenous administration of Technetium 57m Sestamibi. Stress Procedure:  The patient received IV Lexiscan 0.4 mg over 15-seconds.  Technetium 56m Sestamibi injected at 30-seconds.  There were no significant changes with Lexiscan.   Quantitative spect images were obtained after a 45 minute delay.  Transient Ischemic Dilatation (Normal <1.22):  1.01 Lung/Heart Ratio (Normal <0.45):  0.29 QGS EDV:  86 ml QGS ESV:  36 ml LV Ejection Fraction: 58%  Signed by      Rest ECG: NSR - Normal EKG  Stress ECG: No significant change from baseline ECG  QPS Raw Data Images:  Mild diaphragmatic attenuation.  Normal left ventricular size. Stress Images:  Normal homogeneous uptake in all areas of the myocardium. Rest Images:  Normal homogeneous uptake in all areas of the myocardium. Subtraction (SDS):  No evidence of ischemia.  Impression Exercise Capacity:  Lexiscan with no exercise. BP Response:  Normal blood pressure response. Clinical Symptoms:  No significant symptoms noted. ECG Impression:  No significant ST segment change suggestive of ischemia. Comparison with Prior Nuclear Study: No significant change from previous study  Overall Impression:  Low risk stress nuclear study with diaphragmatic attenuation.  LV Wall Motion:  NL LV Function; NL Wall Motion   Runell Gess, MD  05/16/2013 1:56 PM

## 2013-05-21 ENCOUNTER — Ambulatory Visit: Payer: Self-pay | Admitting: Hematology and Oncology

## 2013-05-22 LAB — COMPREHENSIVE METABOLIC PANEL
Albumin: 3.1 g/dL — ABNORMAL LOW (ref 3.4–5.0)
Anion Gap: 3 — ABNORMAL LOW (ref 7–16)
BUN: 11 mg/dL (ref 7–18)
Calcium, Total: 8.6 mg/dL (ref 8.5–10.1)
Co2: 29 mmol/L (ref 21–32)
Creatinine: 0.87 mg/dL (ref 0.60–1.30)
EGFR (Non-African Amer.): >60
Glucose: 86 mg/dL (ref 65–99)
Potassium: 3.8 mmol/L (ref 3.5–5.1)
SGPT (ALT): 12 U/L (ref 12–78)
Sodium: 137 mmol/L (ref 136–145)
Total Protein: 7.2 g/dL (ref 6.4–8.2)

## 2013-05-22 LAB — CBC CANCER CENTER
Basophil %: 1.1 %
Eosinophil #: 0.1 x10 3/mm (ref 0.0–0.7)
HCT: 35.9 % — ABNORMAL LOW (ref 40.0–52.0)
Lymphocyte #: 1.4 x10 3/mm (ref 1.0–3.6)
Platelet: 175 x10 3/mm (ref 150–440)
RBC: 3.94 10*6/uL — ABNORMAL LOW (ref 4.40–5.90)
RDW: 14.3 % (ref 11.5–14.5)

## 2013-05-28 ENCOUNTER — Ambulatory Visit (INDEPENDENT_AMBULATORY_CARE_PROVIDER_SITE_OTHER): Payer: Medicare Other | Admitting: Cardiovascular Disease

## 2013-05-28 ENCOUNTER — Encounter: Payer: Self-pay | Admitting: Cardiovascular Disease

## 2013-05-28 VITALS — BP 100/44 | HR 64 | Ht 71.0 in | Wt 140.0 lb

## 2013-05-28 DIAGNOSIS — I2510 Atherosclerotic heart disease of native coronary artery without angina pectoris: Secondary | ICD-10-CM

## 2013-05-28 DIAGNOSIS — E785 Hyperlipidemia, unspecified: Secondary | ICD-10-CM

## 2013-05-28 DIAGNOSIS — I739 Peripheral vascular disease, unspecified: Secondary | ICD-10-CM

## 2013-05-28 NOTE — Assessment & Plan Note (Signed)
On statin drugs. Dr. Sullivan Lone phalluses lipid profile.

## 2013-05-28 NOTE — Assessment & Plan Note (Signed)
Status post coronary artery bypass grafting in 1997. He has not had a cardiac catheterization since. He recently had a Myoview stress test in our office performed 05/16/13 which was nonischemic

## 2013-05-28 NOTE — Patient Instructions (Addendum)
Your physician wants you to follow-up in: 6 months with Nada Boozer NP and 12 months with Dr Allyson Sabal. You will receive a reminder letter in the mail two months in advance. If you don't receive a letter, please call our office to schedule the follow-up appointment.   We will cancel future doppler appointments for Lower extremity dopplers and carotid dopplers

## 2013-05-28 NOTE — Assessment & Plan Note (Signed)
Status post iliac stenting in the past with a known moderate-sized right iliac aneurysm. He has a known occluded SFAs bilaterally. He also status post remote left carotid endarterectomy with Dopplers performed 2 years ago that showed widely patent internal carotid arteries. He really does not ambulate much.

## 2013-05-28 NOTE — Progress Notes (Signed)
05/28/2013 [name]   May 17, 1929  [id number]  Primary Physician Jon Clos, Ward Primary Cardiologist: [name] [name]   HPI:  79 year old, thin-appearing, married Caucasian male with no children accompanied by his wife today. He has a history of CAD status post coronary artery bypass grafting in 1997. He had a nonischemic Myoview July 27, 2011. He denies chest pain but does have shortness of breath on exertion. He has PVOD status post left carotid endarterectomy in the past, as well as right iliac stenting by [name]. He has occluded SFAs bilaterally but really does not ambulate much. 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 They also radiated his brain to prevent metastasis. He denies any chest pain but he does have dyspnea on exertion. No shortness of breath at rest only with exertion he is able to sleep at night without shortness of breath.I last saw him in the office 6 months ago. He finished his radiation therapy as well as chemotherapy. He's lost 32 pounds saw him and is slowly growing chairback. He denies chest pain but has chronic dyspnea on exertion. He a Myoview stress test performed 05/19/13 that showed no ischemia.    Current Outpatient Prescriptions  Medication Sig Dispense Refill  . cilostazol (PLETAL) 50 MG tablet Take 50 mg by mouth 2 (two) times daily.      Marland Kitchen FLUoxetine (PROZAC) 40 MG capsule Take 40 mg by mouth daily.      Marland Kitchen gabapentin (NEURONTIN) 400 MG capsule Take 300 mg by mouth at bedtime as needed.       Marland Kitchen HYDROcodone-acetaminophen (NORCO/VICODIN) 5-325 MG per tablet Take 1 tablet by mouth every 4 (four) hours as needed. For pain      . lovastatin (MEVACOR) 40 MG tablet Take 40 mg by mouth at bedtime.      Marland Kitchen lubiprostone (AMITIZA) 24 MCG capsule Take 24  mcg by mouth 2 (two) times daily with a meal.      . niacin 500 MG tablet Take 500 mg by mouth daily with breakfast.      . nystatin (MYCOSTATIN) 100000 UNIT/ML suspension       . omeprazole (PRILOSEC) 20 MG capsule Take 20 mg by mouth daily.       No current facility-administered medications for this visit.    Allergies  Allergen Reactions  . Penicillins Anaphylaxis    History   Social History  . Marital Status: Married    Spouse Name: N/A    Number of Children: 0  . Years of Education: N/A   Occupational History  .     Social History Main Topics  . Smoking status: Never Smoker   . Smokeless tobacco: Not on file  . Alcohol Use: No  . Drug Use: Not on file  . Sexually Active: Not on file   Other Topics Concern  . Not on file   Social History Narrative  . No narrative on file     Review of Systems: General: negative for chills, fever, night sweats or weight changes.  Cardiovascular: negative for chest pain, dyspnea on exertion, edema, orthopnea, palpitations, paroxysmal nocturnal dyspnea or shortness of breath Dermatological: negative for rash Respiratory: negative for cough or wheezing Urologic: negative for hematuria Abdominal: negative for nausea, vomiting, diarrhea, bright red blood per rectum, melena, or hematemesis  Neurologic: negative for visual changes, syncope, or dizziness All other systems reviewed and are otherwise negative except as noted above.    Blood pressure 100/44, pulse 64, height 5\' 11"  (1.803 m), weight 140 lb (63.504 kg).  General appearance: alert and no distress Neck: no adenopathy, no carotid bruit, no JVD, supple, symmetrical, trachea midline and thyroid not enlarged, symmetric, no tenderness/mass/nodules Lungs: clear to auscultation bilaterally Heart: regular rate and rhythm, S1, S2 normal, no murmur, click, rub or gallop Extremities: extremities normal, atraumatic, no cyanosis or edema  EKG not performed today  ASSESSMENT AND  PLAN:   CAD (coronary artery disease), hx of CABG in 1997, negative nucs since. Status post coronary artery bypass grafting in 1997. He has not had a cardiac catheterization since. He recently had a Myoview stress test in our office performed 05/16/13 which was nonischemic  PVD (peripheral vascular disease) Status post iliac stenting in the past with a known moderate-sized right iliac aneurysm. He has a known occluded SFAs bilaterally. He also status post remote left carotid endarterectomy with Dopplers performed 2 years ago that showed widely patent internal carotid arteries. He really does not ambulate much.  Hyperlipidemia On statin drugs. Dr. Sullivan Lone phalluses lipid profile.      [name] FACP,FACC,FAHA, [facility] 05/28/2013 9:26 AM

## 2013-06-05 ENCOUNTER — Ambulatory Visit: Payer: Self-pay | Admitting: Hematology and Oncology

## 2013-06-26 ENCOUNTER — Encounter: Payer: Self-pay | Admitting: Hematology and Oncology

## 2013-06-26 ENCOUNTER — Inpatient Hospital Stay: Payer: Self-pay | Admitting: Internal Medicine

## 2013-06-26 LAB — CBC CANCER CENTER
Basophil #: 0.1 x10 3/mm (ref 0.0–0.1)
Basophil %: 0.9 %
Eosinophil #: 0.1 x10 3/mm (ref 0.0–0.7)
HCT: 33 % — ABNORMAL LOW (ref 40.0–52.0)
HGB: 11.5 g/dL — ABNORMAL LOW (ref 13.0–18.0)
Lymphocyte %: 11.5 %
MCH: 31.4 pg (ref 26.0–34.0)
MCHC: 34.8 g/dL (ref 32.0–36.0)
Monocyte %: 9.9 %
Neutrophil #: 6.5 x10 3/mm (ref 1.4–6.5)
Neutrophil %: 76.4 %

## 2013-06-26 LAB — COMPREHENSIVE METABOLIC PANEL
Alkaline Phosphatase: 87 U/L (ref 50–136)
Anion Gap: 7 (ref 7–16)
Chloride: 103 mmol/L (ref 98–107)
Co2: 27 mmol/L (ref 21–32)
Creatinine: 0.97 mg/dL (ref 0.60–1.30)
EGFR (African American): >60
Glucose: 100 mg/dL — ABNORMAL HIGH (ref 65–99)
Osmolality: 275 (ref 275–301)
Potassium: 3.7 mmol/L (ref 3.5–5.1)
SGOT(AST): 16 U/L (ref 15–37)
Sodium: 137 mmol/L (ref 136–145)
Total Protein: 7.3 g/dL (ref 6.4–8.2)

## 2013-06-28 LAB — COMPREHENSIVE METABOLIC PANEL
Albumin: 2.1 g/dL — ABNORMAL LOW (ref 3.4–5.0)
Alkaline Phosphatase: 67 U/L (ref 50–136)
BUN: 10 mg/dL (ref 7–18)
Bilirubin,Total: 0.3 mg/dL (ref 0.2–1.0)
Creatinine: 0.79 mg/dL (ref 0.60–1.30)
Glucose: 82 mg/dL (ref 65–99)
Osmolality: 274 (ref 275–301)
Potassium: 4.1 mmol/L (ref 3.5–5.1)
SGOT(AST): 17 U/L (ref 15–37)
SGPT (ALT): 7 U/L — ABNORMAL LOW (ref 12–78)
Total Protein: 6.4 g/dL (ref 6.4–8.2)

## 2013-06-29 ENCOUNTER — Emergency Department: Payer: Self-pay | Admitting: Emergency Medicine

## 2013-06-29 LAB — URINALYSIS, COMPLETE
Glucose,UR: NEGATIVE mg/dL (ref 0–75)
Ketone: NEGATIVE
Leukocyte Esterase: NEGATIVE
Nitrite: NEGATIVE
Ph: 5 (ref 4.5–8.0)
RBC,UR: 1 /HPF (ref 0–5)
Specific Gravity: 1.005 (ref 1.003–1.030)
WBC UR: 1 /HPF (ref 0–5)

## 2013-06-29 LAB — BASIC METABOLIC PANEL
Anion Gap: 9 (ref 7–16)
BUN: 15 mg/dL (ref 7–18)
Chloride: 103 mmol/L (ref 98–107)
Creatinine: 1.12 mg/dL (ref 0.60–1.30)
EGFR (African American): >60
EGFR (Non-African Amer.): >60
Glucose: 100 mg/dL — ABNORMAL HIGH (ref 65–99)
Osmolality: 273 (ref 275–301)
Sodium: 136 mmol/L (ref 136–145)

## 2013-06-29 LAB — CBC
HCT: 29.1 % — ABNORMAL LOW (ref 40.0–52.0)
MCV: 89 fL (ref 80–100)
Platelet: 260 10*3/uL (ref 150–440)
RBC: 3.26 10*6/uL — ABNORMAL LOW (ref 4.40–5.90)
RDW: 13.2 % (ref 11.5–14.5)

## 2013-07-01 LAB — URINE CULTURE

## 2013-07-02 LAB — BASIC METABOLIC PANEL
BUN: 12 mg/dL (ref 7–18)
Co2: 28 mmol/L (ref 21–32)
Creatinine: 0.99 mg/dL (ref 0.60–1.30)
EGFR (African American): >60
EGFR (Non-African Amer.): >60
Glucose: 110 mg/dL — ABNORMAL HIGH (ref 65–99)
Potassium: 4 mmol/L (ref 3.5–5.1)
Sodium: 138 mmol/L (ref 136–145)

## 2013-07-02 LAB — CBC CANCER CENTER
Eosinophil %: 1.4 %
HCT: 32 % — ABNORMAL LOW (ref 40.0–52.0)
MCHC: 34.8 g/dL (ref 32.0–36.0)
MCV: 89 fL (ref 80–100)
Monocyte #: 0.7 x10 3/mm (ref 0.2–1.0)
Platelet: 328 x10 3/mm (ref 150–440)
WBC: 9.3 x10 3/mm (ref 3.8–10.6)

## 2013-07-06 ENCOUNTER — Ambulatory Visit: Payer: Self-pay | Admitting: Hematology and Oncology

## 2013-07-09 LAB — CBC CANCER CENTER
Basophil #: 0.1 x10 3/mm (ref 0.0–0.1)
Eosinophil #: 0.1 x10 3/mm (ref 0.0–0.7)
Eosinophil %: 1.7 %
HGB: 10.1 g/dL — ABNORMAL LOW (ref 13.0–18.0)
Lymphocyte %: 21.2 %
MCH: 30.2 pg (ref 26.0–34.0)
MCV: 89 fL (ref 80–100)
RBC: 3.34 10*6/uL — ABNORMAL LOW (ref 4.40–5.90)
WBC: 7 x10 3/mm (ref 3.8–10.6)

## 2013-07-09 LAB — CREATININE, SERUM
Creatinine: 0.92 mg/dL (ref 0.60–1.30)
EGFR (Non-African Amer.): >60

## 2013-07-10 ENCOUNTER — Ambulatory Visit: Payer: Self-pay | Admitting: Vascular Surgery

## 2013-07-16 LAB — BASIC METABOLIC PANEL
Anion Gap: 12 (ref 7–16)
BUN: 12 mg/dL (ref 7–18)
Chloride: 105 mmol/L (ref 98–107)
Co2: 25 mmol/L (ref 21–32)
Creatinine: 0.84 mg/dL (ref 0.60–1.30)
EGFR (African American): >60
Glucose: 90 mg/dL (ref 65–99)
Osmolality: 282 (ref 275–301)
Sodium: 142 mmol/L (ref 136–145)

## 2013-07-16 LAB — CBC CANCER CENTER
HCT: 27.8 % — ABNORMAL LOW (ref 40.0–52.0)
HGB: 9.5 g/dL — ABNORMAL LOW (ref 13.0–18.0)
MCHC: 34.1 g/dL (ref 32.0–36.0)
MCV: 88 fL (ref 80–100)
Monocyte #: 0.5 x10 3/mm (ref 0.2–1.0)
Neutrophil #: 5.7 x10 3/mm (ref 1.4–6.5)
Neutrophil %: 77.3 %
RBC: 3.15 10*6/uL — ABNORMAL LOW (ref 4.40–5.90)
WBC: 7.4 x10 3/mm (ref 3.8–10.6)

## 2013-07-18 ENCOUNTER — Ambulatory Visit: Payer: Self-pay | Admitting: Gastroenterology

## 2013-07-19 LAB — PATHOLOGY REPORT

## 2013-07-23 LAB — CBC CANCER CENTER
Basophil #: 0.1 x10 3/mm (ref 0.0–0.1)
Basophil %: 1.1 %
Eosinophil %: 2 %
HGB: 9.5 g/dL — ABNORMAL LOW (ref 13.0–18.0)
Lymphocyte #: 1.5 x10 3/mm (ref 1.0–3.6)
Lymphocyte %: 26.1 %
MCH: 29.4 pg (ref 26.0–34.0)
MCHC: 33.8 g/dL (ref 32.0–36.0)
Monocyte #: 0.4 x10 3/mm (ref 0.2–1.0)
Monocyte %: 7 %
Neutrophil #: 3.7 x10 3/mm (ref 1.4–6.5)
Neutrophil %: 63.8 %
Platelet: 171 x10 3/mm (ref 150–440)
RDW: 13.7 % (ref 11.5–14.5)
WBC: 5.8 x10 3/mm (ref 3.8–10.6)

## 2013-07-23 LAB — BASIC METABOLIC PANEL
Anion Gap: 10 (ref 7–16)
BUN: 12 mg/dL (ref 7–18)
Chloride: 104 mmol/L (ref 98–107)
Glucose: 80 mg/dL (ref 65–99)
Osmolality: 280 (ref 275–301)
Potassium: 3.6 mmol/L (ref 3.5–5.1)
Sodium: 141 mmol/L (ref 136–145)

## 2013-07-30 LAB — BASIC METABOLIC PANEL
Anion Gap: 10 (ref 7–16)
Chloride: 106 mmol/L (ref 98–107)
EGFR (African American): >60
EGFR (Non-African Amer.): >60
Glucose: 125 mg/dL — ABNORMAL HIGH (ref 65–99)
Osmolality: 283 (ref 275–301)
Potassium: 3.7 mmol/L (ref 3.5–5.1)
Sodium: 141 mmol/L (ref 136–145)

## 2013-07-30 LAB — CBC CANCER CENTER
Basophil #: 0.1 x10 3/mm (ref 0.0–0.1)
Basophil %: 1.5 %
Eosinophil #: 0.1 x10 3/mm (ref 0.0–0.7)
Eosinophil %: 2.2 %
HGB: 9.4 g/dL — ABNORMAL LOW (ref 13.0–18.0)
MCV: 87 fL (ref 80–100)
Monocyte #: 0.3 x10 3/mm (ref 0.2–1.0)
Monocyte %: 6.5 %
Neutrophil %: 61.2 %
Platelet: 93 x10 3/mm — ABNORMAL LOW (ref 150–440)
RBC: 3.17 10*6/uL — ABNORMAL LOW (ref 4.40–5.90)
WBC: 4.3 x10 3/mm (ref 3.8–10.6)

## 2013-08-06 ENCOUNTER — Ambulatory Visit: Payer: Self-pay | Admitting: Hematology and Oncology

## 2013-08-13 LAB — COMPREHENSIVE METABOLIC PANEL
Albumin: 2.5 g/dL — ABNORMAL LOW (ref 3.4–5.0)
Anion Gap: 12 (ref 7–16)
Calcium, Total: 9 mg/dL (ref 8.5–10.1)
Chloride: 104 mmol/L (ref 98–107)
Creatinine: 0.78 mg/dL (ref 0.60–1.30)
EGFR (African American): >60
EGFR (Non-African Amer.): >60
Osmolality: 282 (ref 275–301)
SGOT(AST): 14 U/L — ABNORMAL LOW (ref 15–37)
SGPT (ALT): 11 U/L — ABNORMAL LOW (ref 12–78)
Sodium: 141 mmol/L (ref 136–145)
Total Protein: 7.6 g/dL (ref 6.4–8.2)

## 2013-08-13 LAB — CBC CANCER CENTER
Basophil %: 0.8 %
Eosinophil #: 0.1 x10 3/mm (ref 0.0–0.7)
Lymphocyte #: 1.3 x10 3/mm (ref 1.0–3.6)
Lymphocyte %: 12.6 %
MCHC: 32.8 g/dL (ref 32.0–36.0)
MCV: 88 fL (ref 80–100)
Monocyte #: 1.1 x10 3/mm — ABNORMAL HIGH (ref 0.2–1.0)
Neutrophil #: 7.7 x10 3/mm — ABNORMAL HIGH (ref 1.4–6.5)
RBC: 3.34 10*6/uL — ABNORMAL LOW (ref 4.40–5.90)
WBC: 10.3 x10 3/mm (ref 3.8–10.6)

## 2013-09-05 ENCOUNTER — Ambulatory Visit: Payer: Self-pay | Admitting: Hematology and Oncology

## 2013-09-10 LAB — COMPREHENSIVE METABOLIC PANEL
Albumin: 2 g/dL — ABNORMAL LOW (ref 3.4–5.0)
Alkaline Phosphatase: 86 U/L (ref 50–136)
BUN: 14 mg/dL (ref 7–18)
Bilirubin,Total: 0.3 mg/dL (ref 0.2–1.0)
Calcium, Total: 8.2 mg/dL — ABNORMAL LOW (ref 8.5–10.1)
Co2: 26 mmol/L (ref 21–32)
Creatinine: 0.79 mg/dL (ref 0.60–1.30)
EGFR (African American): >60
EGFR (Non-African Amer.): >60
Glucose: 135 mg/dL — ABNORMAL HIGH (ref 65–99)
Osmolality: 276 (ref 275–301)
Potassium: 3.9 mmol/L (ref 3.5–5.1)
Total Protein: 7.2 g/dL (ref 6.4–8.2)

## 2013-09-10 LAB — CBC CANCER CENTER
HGB: 8.5 g/dL — ABNORMAL LOW (ref 13.0–18.0)
Lymphocyte #: 0.9 x10 3/mm — ABNORMAL LOW (ref 1.0–3.6)
Lymphocyte %: 8.8 %
MCH: 26.5 pg (ref 26.0–34.0)
MCHC: 31.8 g/dL — ABNORMAL LOW (ref 32.0–36.0)
MCV: 83 fL (ref 80–100)
RBC: 3.2 10*6/uL — ABNORMAL LOW (ref 4.40–5.90)
RDW: 16.9 % — ABNORMAL HIGH (ref 11.5–14.5)
WBC: 10.6 x10 3/mm (ref 3.8–10.6)

## 2013-09-17 LAB — CBC CANCER CENTER
Basophil #: 0.1 x10 3/mm (ref 0.0–0.1)
Basophil %: 1.3 %
Eosinophil %: 2.6 %
HCT: 27.4 % — ABNORMAL LOW (ref 40.0–52.0)
Lymphocyte %: 18.6 %
MCH: 26.3 pg (ref 26.0–34.0)
MCV: 83 fL (ref 80–100)
Monocyte %: 6.1 %
Neutrophil %: 71.4 %
Platelet: 165 x10 3/mm (ref 150–440)
RDW: 16.8 % — ABNORMAL HIGH (ref 11.5–14.5)
WBC: 6 x10 3/mm (ref 3.8–10.6)

## 2013-09-17 LAB — BASIC METABOLIC PANEL
Anion Gap: 8 (ref 7–16)
Chloride: 104 mmol/L (ref 98–107)
Creatinine: 0.81 mg/dL (ref 0.60–1.30)
EGFR (African American): >60
EGFR (Non-African Amer.): >60
Sodium: 138 mmol/L (ref 136–145)

## 2013-09-19 ENCOUNTER — Emergency Department: Payer: Self-pay | Admitting: Emergency Medicine

## 2013-09-20 LAB — URINALYSIS, COMPLETE
Bilirubin,UR: NEGATIVE
Glucose,UR: NEGATIVE mg/dL (ref 0–75)
Ketone: NEGATIVE
Nitrite: NEGATIVE
Ph: 6 (ref 4.5–8.0)
Protein: NEGATIVE
RBC,UR: 5 /HPF (ref 0–5)
WBC UR: 71 /HPF (ref 0–5)

## 2013-09-24 LAB — CBC CANCER CENTER
Eosinophil #: 0.1 x10 3/mm (ref 0.0–0.7)
Eosinophil %: 2.3 %
HCT: 29.1 % — ABNORMAL LOW (ref 40.0–52.0)
HGB: 9.2 g/dL — ABNORMAL LOW (ref 13.0–18.0)
Lymphocyte #: 1.4 x10 3/mm (ref 1.0–3.6)
MCH: 26.2 pg (ref 26.0–34.0)
Neutrophil %: 57.9 %
Platelet: 102 x10 3/mm — ABNORMAL LOW (ref 150–440)
RBC: 3.53 10*6/uL — ABNORMAL LOW (ref 4.40–5.90)
WBC: 4.3 x10 3/mm (ref 3.8–10.6)

## 2013-09-24 LAB — BASIC METABOLIC PANEL
Anion Gap: 6 — ABNORMAL LOW (ref 7–16)
BUN: 10 mg/dL (ref 7–18)
Calcium, Total: 8.7 mg/dL (ref 8.5–10.1)
Chloride: 105 mmol/L (ref 98–107)
EGFR (African American): >60
EGFR (Non-African Amer.): >60
Glucose: 108 mg/dL — ABNORMAL HIGH (ref 65–99)
Potassium: 3.2 mmol/L — ABNORMAL LOW (ref 3.5–5.1)

## 2013-10-06 ENCOUNTER — Ambulatory Visit: Payer: Self-pay | Admitting: Hematology and Oncology

## 2013-10-08 LAB — CBC CANCER CENTER
Basophil #: 0 x10 3/mm (ref 0.0–0.1)
Eosinophil #: 0.1 x10 3/mm (ref 0.0–0.7)
Eosinophil %: 0.8 %
HCT: 28.5 % — ABNORMAL LOW (ref 40.0–52.0)
HGB: 8.9 g/dL — ABNORMAL LOW (ref 13.0–18.0)
MCH: 25.8 pg — ABNORMAL LOW (ref 26.0–34.0)
MCV: 82 fL (ref 80–100)
Monocyte #: 1 x10 3/mm (ref 0.2–1.0)
Neutrophil #: 5.2 x10 3/mm (ref 1.4–6.5)
Neutrophil %: 69.4 %
RBC: 3.45 10*6/uL — ABNORMAL LOW (ref 4.40–5.90)
RDW: 20 % — ABNORMAL HIGH (ref 11.5–14.5)
WBC: 7.5 x10 3/mm (ref 3.8–10.6)

## 2013-10-08 LAB — COMPREHENSIVE METABOLIC PANEL
Albumin: 2.3 g/dL — ABNORMAL LOW (ref 3.4–5.0)
Alkaline Phosphatase: 75 U/L (ref 50–136)
Anion Gap: 10 (ref 7–16)
BUN: 14 mg/dL (ref 7–18)
Calcium, Total: 8 mg/dL — ABNORMAL LOW (ref 8.5–10.1)
Chloride: 103 mmol/L (ref 98–107)
Co2: 28 mmol/L (ref 21–32)
EGFR (African American): >60
EGFR (Non-African Amer.): >60
Potassium: 3.9 mmol/L (ref 3.5–5.1)
SGOT(AST): 13 U/L — ABNORMAL LOW (ref 15–37)
Total Protein: 6.6 g/dL (ref 6.4–8.2)

## 2013-10-15 LAB — CBC CANCER CENTER
Basophil #: 0.1 x10 3/mm (ref 0.0–0.1)
Eosinophil #: 0.1 x10 3/mm (ref 0.0–0.7)
HGB: 8.3 g/dL — ABNORMAL LOW (ref 13.0–18.0)
Lymphocyte #: 1.2 x10 3/mm (ref 1.0–3.6)
Lymphocyte %: 21.2 %
Monocyte #: 0.3 x10 3/mm (ref 0.2–1.0)
Monocyte %: 5.9 %
Neutrophil %: 69.6 %
Platelet: 139 x10 3/mm — ABNORMAL LOW (ref 150–440)
RBC: 3.28 10*6/uL — ABNORMAL LOW (ref 4.40–5.90)
RDW: 19.4 % — ABNORMAL HIGH (ref 11.5–14.5)

## 2013-10-15 LAB — BASIC METABOLIC PANEL
Calcium, Total: 7.9 mg/dL — ABNORMAL LOW (ref 8.5–10.1)
Co2: 27 mmol/L (ref 21–32)
Creatinine: 0.86 mg/dL (ref 0.60–1.30)
Glucose: 103 mg/dL — ABNORMAL HIGH (ref 65–99)
Sodium: 141 mmol/L (ref 136–145)

## 2013-10-22 LAB — CBC CANCER CENTER
Eosinophil #: 0.2 x10 3/mm (ref 0.0–0.7)
Eosinophil %: 4.4 %
HCT: 26.9 % — ABNORMAL LOW (ref 40.0–52.0)
HGB: 8.3 g/dL — ABNORMAL LOW (ref 13.0–18.0)
Lymphocyte #: 1.2 x10 3/mm (ref 1.0–3.6)
MCH: 25.1 pg — ABNORMAL LOW (ref 26.0–34.0)
MCHC: 30.7 g/dL — ABNORMAL LOW (ref 32.0–36.0)
Monocyte #: 0.2 x10 3/mm (ref 0.2–1.0)
Monocyte %: 5.3 %
Neutrophil #: 2.4 x10 3/mm (ref 1.4–6.5)
Neutrophil %: 59.6 %
RBC: 3.3 10*6/uL — ABNORMAL LOW (ref 4.40–5.90)

## 2013-10-22 LAB — BASIC METABOLIC PANEL
BUN: 12 mg/dL (ref 7–18)
EGFR (African American): >60
EGFR (Non-African Amer.): >60
Potassium: 3.4 mmol/L — ABNORMAL LOW (ref 3.5–5.1)
Sodium: 142 mmol/L (ref 136–145)

## 2013-10-29 LAB — COMPREHENSIVE METABOLIC PANEL
Albumin: 2.3 g/dL — ABNORMAL LOW (ref 3.4–5.0)
Alkaline Phosphatase: 75 U/L
Calcium, Total: 8.3 mg/dL — ABNORMAL LOW (ref 8.5–10.1)
Chloride: 103 mmol/L (ref 98–107)
Co2: 29 mmol/L (ref 21–32)
Glucose: 120 mg/dL — ABNORMAL HIGH (ref 65–99)
Osmolality: 282 (ref 275–301)
Potassium: 3.9 mmol/L (ref 3.5–5.1)
SGOT(AST): 17 U/L (ref 15–37)
SGPT (ALT): 12 U/L (ref 12–78)
Sodium: 140 mmol/L (ref 136–145)
Total Protein: 6.3 g/dL — ABNORMAL LOW (ref 6.4–8.2)

## 2013-10-29 LAB — CBC CANCER CENTER
Basophil #: 0 x10 3/mm (ref 0.0–0.1)
Basophil %: 0.6 %
Eosinophil #: 0.1 x10 3/mm (ref 0.0–0.7)
Eosinophil %: 2.7 %
HCT: 25.4 % — ABNORMAL LOW (ref 40.0–52.0)
HGB: 8.1 g/dL — ABNORMAL LOW (ref 13.0–18.0)
Lymphocyte #: 1 x10 3/mm (ref 1.0–3.6)
Lymphocyte %: 23.5 %
Monocyte #: 0.7 x10 3/mm (ref 0.2–1.0)
Monocyte %: 15.4 %
Neutrophil #: 2.5 x10 3/mm (ref 1.4–6.5)
Platelet: 276 x10 3/mm (ref 150–440)
RBC: 3.08 10*6/uL — ABNORMAL LOW (ref 4.40–5.90)
WBC: 4.3 x10 3/mm (ref 3.8–10.6)

## 2013-11-05 ENCOUNTER — Ambulatory Visit: Payer: Self-pay | Admitting: Hematology and Oncology

## 2013-11-05 LAB — CBC CANCER CENTER
Basophil #: 0.1 x10 3/mm (ref 0.0–0.1)
Eosinophil #: 0.1 x10 3/mm (ref 0.0–0.7)
Eosinophil %: 3.9 %
HCT: 29.4 % — ABNORMAL LOW (ref 40.0–52.0)
Lymphocyte #: 1.2 x10 3/mm (ref 1.0–3.6)
MCH: 26.5 pg (ref 26.0–34.0)
MCHC: 32.1 g/dL (ref 32.0–36.0)
MCV: 83 fL (ref 80–100)
Monocyte #: 0.4 x10 3/mm (ref 0.2–1.0)
Neutrophil #: 2 x10 3/mm (ref 1.4–6.5)
Neutrophil %: 51.5 %
Platelet: 192 x10 3/mm (ref 150–440)
RDW: 21.1 % — ABNORMAL HIGH (ref 11.5–14.5)

## 2013-11-05 LAB — COMPREHENSIVE METABOLIC PANEL
Alkaline Phosphatase: 74 U/L
BUN: 14 mg/dL (ref 7–18)
EGFR (African American): >60
EGFR (Non-African Amer.): >60
Glucose: 119 mg/dL — ABNORMAL HIGH (ref 65–99)
Osmolality: 281 (ref 275–301)
Potassium: 4 mmol/L (ref 3.5–5.1)
SGOT(AST): 17 U/L (ref 15–37)
Sodium: 140 mmol/L (ref 136–145)
Total Protein: 6.8 g/dL (ref 6.4–8.2)

## 2013-11-15 ENCOUNTER — Emergency Department (HOSPITAL_COMMUNITY): Payer: Medicare Other

## 2013-11-15 ENCOUNTER — Encounter (HOSPITAL_COMMUNITY): Payer: Self-pay | Admitting: Emergency Medicine

## 2013-11-15 ENCOUNTER — Telehealth: Payer: Self-pay | Admitting: Cardiovascular Disease

## 2013-11-15 ENCOUNTER — Inpatient Hospital Stay (HOSPITAL_COMMUNITY)
Admission: EM | Admit: 2013-11-15 | Discharge: 2013-11-20 | DRG: 308 | Disposition: A | Discharge status: Home / Self Care | Payer: Medicare Other | Attending: Internal Medicine | Admitting: Internal Medicine

## 2013-11-15 DIAGNOSIS — Z87891 Personal history of nicotine dependence: Secondary | ICD-10-CM

## 2013-11-15 DIAGNOSIS — I498 Other specified cardiac arrhythmias: Secondary | ICD-10-CM | POA: Diagnosis present

## 2013-11-15 DIAGNOSIS — C787 Secondary malignant neoplasm of liver and intrahepatic bile duct: Secondary | ICD-10-CM | POA: Diagnosis present

## 2013-11-15 DIAGNOSIS — I10 Essential (primary) hypertension: Secondary | ICD-10-CM | POA: Diagnosis present

## 2013-11-15 DIAGNOSIS — I723 Aneurysm of iliac artery: Secondary | ICD-10-CM

## 2013-11-15 DIAGNOSIS — I2510 Atherosclerotic heart disease of native coronary artery without angina pectoris: Secondary | ICD-10-CM | POA: Diagnosis present

## 2013-11-15 DIAGNOSIS — Z79899 Other long term (current) drug therapy: Secondary | ICD-10-CM | POA: Diagnosis present

## 2013-11-15 DIAGNOSIS — C3490 Malignant neoplasm of unspecified part of unspecified bronchus or lung: Secondary | ICD-10-CM | POA: Diagnosis present

## 2013-11-15 DIAGNOSIS — J189 Pneumonia, unspecified organism: Secondary | ICD-10-CM | POA: Diagnosis present

## 2013-11-15 DIAGNOSIS — K5900 Constipation, unspecified: Secondary | ICD-10-CM | POA: Diagnosis present

## 2013-11-15 DIAGNOSIS — I4891 Unspecified atrial fibrillation: Principal | ICD-10-CM | POA: Diagnosis present

## 2013-11-15 DIAGNOSIS — G609 Hereditary and idiopathic neuropathy, unspecified: Secondary | ICD-10-CM | POA: Diagnosis present

## 2013-11-15 DIAGNOSIS — E860 Dehydration: Secondary | ICD-10-CM | POA: Diagnosis present

## 2013-11-15 DIAGNOSIS — C229 Malignant neoplasm of liver, not specified as primary or secondary: Secondary | ICD-10-CM

## 2013-11-15 DIAGNOSIS — I739 Peripheral vascular disease, unspecified: Secondary | ICD-10-CM

## 2013-11-15 DIAGNOSIS — E785 Hyperlipidemia, unspecified: Secondary | ICD-10-CM | POA: Diagnosis present

## 2013-11-15 DIAGNOSIS — Z66 Do not resuscitate: Secondary | ICD-10-CM | POA: Diagnosis present

## 2013-11-15 DIAGNOSIS — I471 Supraventricular tachycardia: Secondary | ICD-10-CM

## 2013-11-15 DIAGNOSIS — Z951 Presence of aortocoronary bypass graft: Secondary | ICD-10-CM

## 2013-11-15 DIAGNOSIS — E039 Hypothyroidism, unspecified: Secondary | ICD-10-CM | POA: Diagnosis present

## 2013-11-15 LAB — CBC
HCT: 26.6 % — ABNORMAL LOW (ref 39.0–52.0)
MCHC: 31.6 g/dL (ref 30.0–36.0)
Platelets: 159 10*3/uL (ref 150–400)
RBC: 3.2 MIL/uL — ABNORMAL LOW (ref 4.22–5.81)
RDW: 19.9 % — ABNORMAL HIGH (ref 11.5–15.5)
WBC: 6 10*3/uL (ref 4.0–10.5)

## 2013-11-15 MED ORDER — LEVOFLOXACIN IN D5W 750 MG/150ML IV SOLN
750.0000 mg | Freq: Once | INTRAVENOUS | Status: AC
  Administered 2013-11-15: 750 mg via INTRAVENOUS
  Filled 2013-11-15: qty 150

## 2013-11-15 MED ORDER — DILTIAZEM LOAD VIA INFUSION
20.0000 mg | Freq: Once | INTRAVENOUS | Status: AC
  Administered 2013-11-15: 20 mg via INTRAVENOUS
  Filled 2013-11-15: qty 20

## 2013-11-15 MED ORDER — DILTIAZEM HCL 100 MG IV SOLR
5.0000 mg/h | INTRAVENOUS | Status: DC
  Administered 2013-11-15: 5 mg/h via INTRAVENOUS
  Administered 2013-11-15: 15 mg/h via INTRAVENOUS
  Administered 2013-11-15: 10 mg/h via INTRAVENOUS
  Administered 2013-11-16: 06:00:00 15 mg/h via INTRAVENOUS
  Filled 2013-11-15 (×3): qty 100

## 2013-11-15 NOTE — Telephone Encounter (Signed)
Resting HR 130 Resp 18-20 BP 110/70.  Stated pt c/o R side CP x 2-3 weeks and feeling fatigued.  Stated pt denied CP or SOB now.  Stated pt doesn't want to go to the hospital.  [name], [name] notified and advised ER for evaluation, but if pt won't go then schedule tomorrow if pt is asymptomatic now.  If symptomatic, ER tonight.  Call to pt's wife, [name] and informed ER advised.  Stated pt doesn't want to go to hospital.  Informed if pt is w/o symptoms now, can schedule for tomorrow w/ Extender but may need to be sent via ambulance to ER from office if symptoms worse than described.  Verbalized understanding and appt scheduled for tomorrow at 11am w/ Jon Boozer, [name].  Wife has dentist appt tomorrow morning at 8am and may not be available.  Pt w/ hearing difficulty.

## 2013-11-15 NOTE — ED Provider Notes (Signed)
CSN: [id number]     Arrival date & time 11/15/13  2123 History   First MD Initiated Contact with Patient 11/15/13 2125     Chief Complaint  Patient presents with  . Tachycardia    HPI  Patient presents with concern of mild dyspnea, tachycardia. Patient states that he is unaware of when his symptoms began, but currently they are mildly uncomfortable. With these symptoms he had his vital signs checked, was found to be tachycardic.  He was referred here for evaluation. He denies new lightheadedness, syncope, fever, chills. He notices dyspnea is mild, nonexertional. There is no exertional chest pain, no chest pain at all. No confusion, disorientation. And patient does have history of cancer, for which he takes chemotherapy.  He does not know when he last received chemotherapy. Patient has chronic indwelling Foley catheter as well.  On patient denies a history of atrial fibrillation.  He does endorse a history of coronary artery disease.  Past Medical History  Diagnosis Date  . Hypertension   . PVD (peripheral vascular disease)     06/27/06 widley patent subclavian vertebrl arteries. right CIA stent  . CAD (coronary artery disease)   . S/P CABG x 3 1997  . Iliac aneurysm     doppler 07/27/12: 3.1x3.0cm  . Cancer     Lung & liver- Dr. Wendie Simmer  . Malignant neoplasm of pharynx, unspecified   . Other and unspecified hyperlipidemia   . Carotid artery disease     sstatus post remote left carotid endarterectomy  . Hyperlipidemia    Past Surgical History  Procedure Laterality Date  . Coronary artery bypass graft  1997  . Carotid endarterectomy      Left  . Cervical discectomy    . Lumbar disc surgery     Family History  Problem Relation Age of Onset  . Cancer Brother   . Heart failure Father   . Diabetes Sister    History  Substance Use Topics  . Smoking status: Never Smoker   . Smokeless tobacco: Not on file  . Alcohol Use: No    Review of Systems  Constitutional:   Per HPI, otherwise negative  HENT:       Per HPI, otherwise negative  Respiratory:       Per HPI, otherwise negative  Cardiovascular:       Per HPI, otherwise negative  Gastrointestinal: Negative for vomiting.  Endocrine:       Negative aside from HPI  Genitourinary:       Neg aside from HPI   Musculoskeletal:       Per HPI, otherwise negative  Skin: Negative.   Neurological: Negative for syncope.    Allergies  Penicillins  Home Medications   Current Outpatient Rx  Name  Route  Sig  Dispense  Refill  . cilostazol (PLETAL) 50 MG tablet   Oral   Take 50 mg by mouth 2 (two) times daily.         Marland Kitchen FLUoxetine (PROZAC) 40 MG capsule   Oral   Take 40 mg by mouth daily.         Marland Kitchen gabapentin (NEURONTIN) 400 MG capsule   Oral   Take 400 mg by mouth at bedtime.          Marland Kitchen HYDROcodone-acetaminophen (NORCO/VICODIN) 5-325 MG per tablet   Oral   Take 1 tablet by mouth every 4 (four) hours as needed. For pain         . lovastatin (MEVACOR) 40  MG tablet   Oral   Take 40 mg by mouth at bedtime.         Marland Kitchen lubiprostone (AMITIZA) 24 MCG capsule   Oral   Take 24 mcg by mouth 2 (two) times daily with a meal.         . niacin 500 MG tablet   Oral   Take 500 mg by mouth daily with breakfast.         . omeprazole (PRILOSEC) 20 MG capsule   Oral   Take 20 mg by mouth daily.          BP 103/53  Pulse 80  Temp(Src) 97.8 F (36.6 C)  Resp 19  SpO2 98% Physical Exam  Nursing note and vitals reviewed. Constitutional: He is oriented to person, place, and time. He appears well-developed. No distress.  HENT:  Head: Normocephalic and atraumatic.  Eyes: Conjunctivae and EOM are normal.  Cardiovascular: An irregularly irregular rhythm present. Tachycardia present.   Pulmonary/Chest: Effort normal. No stridor. No respiratory distress.  Abdominal: He exhibits no distension.  Genitourinary:  Foley catheter in place, no other gross abnormalities.  Musculoskeletal: He  exhibits no edema.  Neurological: He is alert and oriented to person, place, and time.  Skin: Skin is warm and dry.  Psychiatric: He has a normal mood and affect.    ED Course  Procedures (including critical care time) Labs Review Labs Reviewed  BASIC METABOLIC PANEL  CBC  MAGNESIUM  PRO B NATRIURETIC PEPTIDE  TSH  POCT I-STAT TROPONIN I   Imaging Review Dg Chest Port 1 View  11/15/2013   CLINICAL DATA:  Chest pain and shortness of breath.  EXAM: PORTABLE CHEST - 1 VIEW  COMPARISON:  None.  FINDINGS: Patient has a left-sided power port, tip to level of the upper right atrium. Patient has had median sternotomy and CABG. Heart is normal in size.  There is dense opacity involving the right lung base, partially obscuring hemidiaphragm. Left lung is clear.  IMPRESSION: Right lower lobe infiltrate.   Electronically Signed   By: Rosalie Gums M.D.   On: 11/15/2013 22:05    EKG Interpretation    Date/Time:  Thursday November 15 2013 21:27:23 EST Ventricular Rate:  128 PR Interval:  117 QRS Duration: 98 QT Interval:  298 QTC Calculation: 435 R Axis:   -5 Text Interpretation:  Ectopic atrial tachycardia, unifocal Probable anteroseptal infarct, old Repolarization abnormality, prob rate related Baseline wander in lead(s) II Atrial fibrillation with rapid ventricular response  versus atrial tachycarda w ectopy Abnormal ekg Confirmed by Gerhard Munch  MD [phone]) on 11/15/2013 10:56:58 PM           On initial evaluation the patient's fibrillation with rapid ventricular response.  Patient received diltiazem, fluids.  Patient's x-ray demonstrates right lower lobe infiltrate concerning for pneumonia.  Antibiotics ordered.  11:53 PM  HR 105 - no new complaints.  Initial blood draw was lost - labs redrawn.  MDM  No diagnosis found. This patient presents with mild dyspnea, tachycardia.  On exam he is going to be in atrial fibrillation with rapid ventricular response.  Patient's  evaluation is also notable for demonstration of pneumonia.  Patient received IV fluids, antibiotics, diltiazem bolus and drip. The patient's heart rate decreased appropriately, he developed male in no condition struck his emergency department course.  With this atrial fibrillation, rapid ventricular response, pneumonia, he was admitted for further evaluation and management.  CRITICAL CARE Performed by: Gerhard Munch Total critical care  time: 40 Critical care time was exclusive of separately billable procedures and treating other patients. Critical care was necessary to treat or prevent imminent or life-threatening deterioration. Critical care was time spent personally by me on the following activities: development of treatment plan with patient and/or surrogate as well as nursing, discussions with consultants, evaluation of patient's response to treatment, examination of patient, obtaining history from patient or surrogate, ordering and performing treatments and interventions, ordering and review of laboratory studies, ordering and review of radiographic studies, pulse oximetry and re-evaluation of patient's condition.     Gerhard Munch, MD 11/15/13 [phone]

## 2013-11-15 NOTE — ED Notes (Signed)
Per EMS, pt coming from home c/o of "high heart rate". No CP. HR: 128 and A-fib on monitor. Pt had recent UTI, foley catheter in place. CBG 193. Pt reports slight SOB.

## 2013-11-15 NOTE — ED Notes (Signed)
Lab on their way to redraw blood.

## 2013-11-15 NOTE — ED Notes (Signed)
Spoke with pharmacy, levoquin and cardizem are compatible.

## 2013-11-15 NOTE — ED Notes (Signed)
Spoke with Lab, pt blood work stuck in tube station, will call lab to redraw

## 2013-11-16 ENCOUNTER — Ambulatory Visit: Payer: Medicare Other | Admitting: Cardiology

## 2013-11-16 ENCOUNTER — Inpatient Hospital Stay (HOSPITAL_COMMUNITY): Payer: Medicare Other

## 2013-11-16 ENCOUNTER — Encounter (HOSPITAL_COMMUNITY): Payer: Self-pay | Admitting: Surgery

## 2013-11-16 DIAGNOSIS — I4891 Unspecified atrial fibrillation: Secondary | ICD-10-CM | POA: Diagnosis present

## 2013-11-16 DIAGNOSIS — I498 Other specified cardiac arrhythmias: Secondary | ICD-10-CM

## 2013-11-16 DIAGNOSIS — C229 Malignant neoplasm of liver, not specified as primary or secondary: Secondary | ICD-10-CM

## 2013-11-16 DIAGNOSIS — J189 Pneumonia, unspecified organism: Secondary | ICD-10-CM

## 2013-11-16 DIAGNOSIS — C3490 Malignant neoplasm of unspecified part of unspecified bronchus or lung: Secondary | ICD-10-CM

## 2013-11-16 HISTORY — DX: Unspecified atrial fibrillation: I48.91

## 2013-11-16 HISTORY — DX: Pneumonia, unspecified organism: J18.9

## 2013-11-16 LAB — PRO B NATRIURETIC PEPTIDE: Pro B Natriuretic peptide (BNP): 1322 pg/mL — ABNORMAL HIGH (ref 0–450)

## 2013-11-16 LAB — BASIC METABOLIC PANEL
BUN: 13 mg/dL (ref 6–23)
Calcium: 7.8 mg/dL — ABNORMAL LOW (ref 8.4–10.5)
Chloride: 103 mEq/L (ref 96–112)
Creatinine, Ser: 0.6 mg/dL (ref 0.50–1.35)
GFR calc Af Amer: >90 mL/min (ref >90)
GFR calc non Af Amer: >90 mL/min (ref >90)
Potassium: 4.1 mEq/L (ref 3.5–5.1)

## 2013-11-16 LAB — MAGNESIUM: Magnesium: 1.5 mg/dL (ref 1.5–2.5)

## 2013-11-16 LAB — PROTIME-INR: Prothrombin Time: 16.2 seconds — ABNORMAL HIGH (ref 11.6–15.2)

## 2013-11-16 MED ORDER — SODIUM CHLORIDE 0.9 % IV SOLN: INTRAVENOUS | Status: AC

## 2013-11-16 MED ORDER — MORPHINE SULFATE 2 MG/ML IJ SOLN
2.0000 mg | INTRAMUSCULAR | Status: DC | PRN
  Administered 2013-11-16 (×3): 2 mg via INTRAVENOUS
  Filled 2013-11-16 (×3): qty 1

## 2013-11-16 MED ORDER — HEPARIN BOLUS VIA INFUSION
3000.0000 [IU] | Freq: Once | INTRAVENOUS | Status: AC
  Administered 2013-11-16: 03:00:00 3000 [IU] via INTRAVENOUS
  Filled 2013-11-16: qty 3000

## 2013-11-16 MED ORDER — DEXTROSE 5 % IV SOLN: 500.0000 mg | INTRAVENOUS | Status: DC

## 2013-11-16 MED ORDER — HEPARIN (PORCINE) IN NACL 100-0.45 UNIT/ML-% IJ SOLN
900.0000 [IU]/h | INTRAMUSCULAR | Status: DC
  Administered 2013-11-16: 04:00:00 900 [IU]/h via INTRAVENOUS
  Filled 2013-11-16: qty 250

## 2013-11-16 MED ORDER — HEPARIN SODIUM (PORCINE) 5000 UNIT/ML IJ SOLN
5000.0000 [IU] | Freq: Three times a day (TID) | INTRAMUSCULAR | Status: DC
  Filled 2013-11-16: qty 1

## 2013-11-16 MED ORDER — DEXTROSE 5 % IV SOLN: 1.0000 g | INTRAVENOUS | Status: DC

## 2013-11-16 MED ORDER — MORPHINE SULFATE 2 MG/ML IJ SOLN
1.0000 mg | INTRAMUSCULAR | Status: DC | PRN
  Administered 2013-11-16: 2 mg via INTRAVENOUS
  Administered 2013-11-16: 1 mg via INTRAVENOUS
  Filled 2013-11-16 (×2): qty 1

## 2013-11-16 MED ORDER — LEVOFLOXACIN IN D5W 750 MG/150ML IV SOLN
750.0000 mg | INTRAVENOUS | Status: DC
  Administered 2013-11-16 – 2013-11-19 (×4): 750 mg via INTRAVENOUS
  Filled 2013-11-16 (×5): qty 150

## 2013-11-16 MED ORDER — SODIUM CHLORIDE 0.9 % IJ SOLN
10.0000 mL | INTRAMUSCULAR | Status: DC | PRN
  Administered 2013-11-16: 20 mL

## 2013-11-16 MED ORDER — DILTIAZEM HCL 60 MG PO TABS
60.0000 mg | ORAL_TABLET | Freq: Four times a day (QID) | ORAL | Status: DC
  Administered 2013-11-16 – 2013-11-19 (×12): 60 mg via ORAL
  Filled 2013-11-16 (×16): qty 1

## 2013-11-16 MED ORDER — LEVOFLOXACIN IN D5W 750 MG/150ML IV SOLN: 750.0000 mg | INTRAVENOUS | Status: DC

## 2013-11-16 MED ORDER — FENTANYL CITRATE 0.05 MG/ML IJ SOLN
50.0000 ug | Freq: Once | INTRAMUSCULAR | Status: AC
  Administered 2013-11-16: 50 ug via INTRAVENOUS
  Filled 2013-11-16: qty 2

## 2013-11-16 NOTE — Progress Notes (Signed)
ANTICOAGULATION CONSULT NOTE - Initial Consult  Pharmacy Consult for Heparin  Indication: atrial fibrillation  Allergies  Allergen Reactions  . Penicillins Anaphylaxis   Patient Measurements: Height: 5\' 11"  (180.3 cm) Weight: 147 lb 7.8 oz (66.9 kg) IBW/kg (Calculated) : 75.3  Vital Signs: Temp: 97.8 F (36.6 C) (12/12 0126) Temp src: Oral (12/12 0126) BP: 103/49 mmHg (12/12 0126) Pulse Rate: 95 (12/12 0126)  Labs:  Recent Labs  11/15/13 2341  HGB 8.4*  HCT 26.6*  PLT 159  CREATININE 0.60   Estimated Creatinine Clearance: 67.4 ml/min (by C-G formula based on Cr of 0.6).  Medical History: Past Medical History  Diagnosis Date  . Hypertension   . PVD (peripheral vascular disease)     06/27/06 widley patent subclavian vertebrl arteries. right CIA stent  . CAD (coronary artery disease)   . S/P CABG x 3 1997  . Iliac aneurysm     doppler 07/27/12: 3.1x3.0cm  . Cancer     Lung & liver- Dr. Wendie Simmer  . Malignant neoplasm of pharynx, unspecified   . Other and unspecified hyperlipidemia   . Carotid artery disease     sstatus post remote left carotid endarterectomy  . Hyperlipidemia    Assessment: 79 y/o M in afib/RVR, to start heparin per pharmacy. No anticoagulation PTA. Pt with lung/liver CA currently undergoing chemo. Noted decreased H/H, no overt bleeding noted. Will check INR with first HL, no reason to believe abnormal.   Goal of Therapy:  Heparin level 0.3-0.7 units/ml Monitor platelets by anticoagulation protocol: Yes   Plan:  -Heparin 3000 units BOLUS x 1 -Start heparin drip at 900 units/hr -8 hour HL at 1100 -Daily CBC/HL -Monitor for bleeding  Thank you for allowing me to take part in this patient's care,  Abran Duke, PharmD Clinical Pharmacist Phone: [phone] Pager: [phone] 11/16/2013 2:39 AM

## 2013-11-16 NOTE — Progress Notes (Signed)
[facility] PROGRESS NOTE Interim History: 79 y.o. male who presents with c/o mild dyspnea and tachycardia. He is unaware of when symptoms began but today he called his cardiologists office who recommended he come into the ED. 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 Found to have post obstructive pNA with A. Fib with RVR    Assessment/Plan: SVT: - rate controlled on diltiazem drip, change to otral, d/c heparin not on a fib.. - now 70's. - most likely due to PNA.  Obstructive pneumonia/right effusion: - change antibiotics to rocephin and azithro. - fortunately effusion not loculated. - afebrile.  Cancer of lung: - consult oncology    Code Status: Full Code  Family Communication: No family in room  Disposition Plan: Admit to inpatient     Consultants:  none  Procedures:  CT chest  Antibiotics: Rocephin and azithro HPI/Subjective: Relates some SOB, feels better than yesterday.   Objective: Filed Vitals:   11/16/13 0023 11/16/13 0040 11/16/13 0126 11/16/13 0507  BP: 101/50 110/62 103/49 98/74  Pulse: 91 94 95 92  Temp:   97.8 F (36.6 C) 98.4 F (36.9 C)  TempSrc:   Oral Oral  Resp:  18 18 18   Height:   5\' 11"  (1.803 m)   Weight:   66.9 kg (147 lb 7.8 oz)   SpO2: 96% 96% 97% 94%    Intake/Output Summary (Last 24 hours) at 11/16/13 1244 Last data filed at 11/16/13 7846  Gross per 24 hour  Intake      0 ml  Output    400 ml  Net   -400 ml   Filed Weights   11/16/13 0126  Weight: 66.9 kg (147 lb 7.8 oz)    Exam:  General: Alert, awake, oriented x3, in no acute distress.  HEENT: No bruits, no goiter.  Heart: Regular rate and rhythm, without murmurs, rubs, gallops.  Lungs: Good air movement,  Abdomen: Soft, nontender, nondistended, positive bowel sounds.     Data Reviewed: Basic Metabolic Panel:  Recent Labs Lab 11/15/13 2341  NA 136  K 4.1  CL 103  CO2 24  GLUCOSE 105*  BUN 13  CREATININE  0.60  CALCIUM 7.8*  MG 1.5   Liver Function Tests: No results found for this basename: AST, ALT, ALKPHOS, BILITOT, PROT, ALBUMIN,  in the last 168 hours No results found for this basename: LIPASE, AMYLASE,  in the last 168 hours No results found for this basename: AMMONIA,  in the last 168 hours CBC:  Recent Labs Lab 11/15/13 2341  WBC 6.0  HGB 8.4*  HCT 26.6*  MCV 83.1  PLT 159   Cardiac Enzymes: No results found for this basename: CKTOTAL, CKMB, CKMBINDEX, TROPONINI,  in the last 168 hours BNP (last 3 results)  Recent Labs  11/15/13 2341  PROBNP 1322.0*   CBG: No results found for this basename: GLUCAP,  in the last 168 hours  No results found for this or any previous visit (from the past 240 hour(s)).   Studies: Ct Chest Wo Contrast  11/16/2013   CLINICAL DATA:  Mild dyspnea; tachycardia.  EXAM: CT CHEST WITHOUT CONTRAST  TECHNIQUE: Multidetector CT imaging of the chest was performed following the standard protocol without IV contrast.  COMPARISON:  Chest radiograph performed 11/15/2013  FINDINGS: There is a diffusely irregular mass at the right lower lobe, measuring approximately 9.5 x 6.3 x 8.0 cm, with minimal associated calcification. This is compatible with the patient's known primary  lung malignancy. Associated nodularity is noted within the surrounding lung; underlying airspace opacity likely reflects post-obstructive pneumonia. There is an associated small right pleural effusion.  Minimal emphysematous change is noted at the right upper lobe. Calcified and noncalcified pleural plaques likely reflect prior asbestos exposure. There is nodularity along the right major fissure, measuring 1.8 cm, compatible with metastatic disease. An 8 mm nodule is noted at the left lung base (image 46 of 73). A small 5 mm nodule is noted at the periphery of the right middle lobe (image 44 of 73).  Diffuse coronary artery calcifications are seen. The mediastinum is otherwise unremarkable in  appearance. There may be right hilar lymphadenopathy, though this is not well assessed without contrast. Scattered calcific atherosclerotic disease is noted along the thoracic aorta and proximal great vessels. No pericardial effusion is identified.  A 1.0 cm hypodensity is noted within the left thyroid lobe. The thyroid gland is otherwise unremarkable in appearance. No axillary lymphadenopathy is seen. A left-sided chest port is noted with its distal tip about the cavoatrial junction. The patient is status post median sternotomy.  The visualized portions of the liver and spleen are unremarkable in appearance. A few small stones are seen layering dependently within the gallbladder. The visualized portions of the pancreas and adrenal glands are within normal limits, with mild nonspecific stranding about the adrenal glands. A 2.9 cm cyst is noted at the upper pole of the right kidney. Scattered calcific atherosclerotic disease is noted along the proximal abdominal aorta and its branches, including at the origins of both renal arteries.  IMPRESSION: 1. Diffusely irregular mass at the right lower lung lobe, measuring 9.5 x 6.3 x 8.0 cm, with minimal associated calcification. This is compatible with the patient's known primary lung malignancy. 2. Underlying airspace opacity likely reflects post-obstructive pneumonia. Associated small right pleural effusion. 3. Nodularity along the right major fissure, measuring 1.8 cm, compatible with metastatic disease; additional nodules noted at the periphery of the right middle lobe and left lung base, likely reflecting satellite lesions. There may be right hilar lymphadenopathy, though this is not well assessed without contrast. 4. Diffuse coronary artery calcifications seen. 5. Cholelithiasis noted; the visualized gallbladder otherwise grossly unremarkable. 6. Right renal cyst. 7. Scattered calcific atherosclerotic disease along the proximal abdominal aorta and its branches,  including at the origins of both renal arteries.   Electronically Signed   By: Roanna Raider M.D.   On: 11/16/2013 01:12   Dg Chest Port 1 View  11/15/2013   CLINICAL DATA:  Chest pain and shortness of breath.  EXAM: PORTABLE CHEST - 1 VIEW  COMPARISON:  None.  FINDINGS: Patient has a left-sided power port, tip to level of the upper right atrium. Patient has had median sternotomy and CABG. Heart is normal in size.  There is dense opacity involving the right lung base, partially obscuring hemidiaphragm. Left lung is clear.  IMPRESSION: Right lower lobe infiltrate.   Electronically Signed   By: Rosalie Gums M.D.   On: 11/15/2013 22:05    Scheduled Meds: . sodium chloride   Intravenous STAT  . levofloxacin (LEVAQUIN) IV  750 mg Intravenous Q24H   Continuous Infusions: . diltiazem (CARDIZEM) infusion 15 mg/hr (11/16/13 0551)  . heparin 900 Units/hr (11/16/13 0346)     Marinda Elk  [facility] Pager [phone].  If 8PM-8AM, please contact night-coverage at [email or web address], password [facility] 11/16/2013, 12:44 PM  LOS: 1 day

## 2013-11-16 NOTE — Progress Notes (Signed)
Notified on-call MD of patient requesting pain med for neck pain due to previous surgery on neck,for patient had nothing ordered for pain. Morphine was ordered 1-2 mg. Administered 1mg  per PRN order. Currently patient has Cardizem drip and Heparin drip infusing and is tolerating without any discomfort. Will continue to monitor patient to the end of shift.

## 2013-11-16 NOTE — Care Management Note (Addendum)
  Page 2 of 2   11/20/2013     2:54:34 PM   CARE MANAGEMENT NOTE 11/20/2013  Patient:  MARQUON, ALCALA   Account Number:  [id number]  Date Initiated:  11/16/2013  Documentation initiated by:  [facility]  Subjective/Objective Assessment:   79 y.o. male who presents with c/o mild dyspnea and tachycardia.  He is unaware of when symptoms began but today he called his cardiologists office who recommended he come into the ED.//Home with spouse.     Action/Plan:   Cardizem gtts; IV abx; IV heparin//Resume HH orders   Anticipated DC Date:  11/16/2013   Anticipated DC Plan:  HOME W HOME HEALTH SERVICES      DC Planning Services  CM consult      [facility] Choice  Resumption Of Svcs/PTA Provider   Choice offered to / List presented to:          [facility] arranged  HH-1 RN  HH-10 DISEASE MANAGEMENT  HH-2 PT      [facility] agency  [facility]   Status of service:  Completed, signed off Medicare Important Message given?   (If response is "NO", the following Medicare IM given date fields will be blank) Date Medicare IM given:   Date Additional Medicare IM given:    Discharge Disposition:    Per UR Regulation:    If discussed at Long Length of Stay Meetings, dates discussed:   11/20/2013    Comments:  11/16/13 1415 Leiyah Maultsby Lucretia Roers, RN, BSN, NCM [phone] Pt currently active with [facility] for RN/PT services.  Resumption of care requested.  Ayesha Rumpf, RN of [facility] notified.  No DME needs identified at this time.

## 2013-11-16 NOTE — Progress Notes (Signed)
ANTIBIOTIC CONSULT NOTE - INITIAL  Pharmacy Consult for adjust antibiotics for renal fxn Indication: Levofloxacin for pneumonia  Allergies  Allergen Reactions  . Penicillins Anaphylaxis    Patient Measurements: Height: 5\' 11"  (180.3 cm) Weight: 147 lb 7.8 oz (66.9 kg) IBW/kg (Calculated) : 75.3 Adjusted Body Weight:   Vital Signs: Temp: 97.6 F (36.4 C) (12/12 1317) Temp src: Oral (12/12 1317) BP: 102/49 mmHg (12/12 1317) Pulse Rate: 87 (12/12 1317) Intake/Output from previous day: 12/11 0701 - 12/12 0700 In: -  Out: 100 [Urine:100] Intake/Output from this shift: Total I/O In: 0  Out: 575 [Urine:575]  Labs:  Recent Labs  11/15/13 2341  WBC 6.0  HGB 8.4*  PLT 159  CREATININE 0.60   Estimated Creatinine Clearance: 67.4 ml/min (by C-G formula based on Cr of 0.6). No results found for this basename: VANCOTROUGH, VANCOPEAK, VANCORANDOM, GENTTROUGH, GENTPEAK, GENTRANDOM, TOBRATROUGH, TOBRAPEAK, TOBRARND, AMIKACINPEAK, AMIKACINTROU, AMIKACIN,  in the last 72 hours   Microbiology: No results found for this or any previous visit (from the past 720 hour(s)).  Medical History: Past Medical History  Diagnosis Date  . Hypertension   . PVD (peripheral vascular disease)     06/27/06 widley patent subclavian vertebrl arteries. right CIA stent  . CAD (coronary artery disease)   . S/P CABG x 3 1997  . Iliac aneurysm     doppler 07/27/12: 3.1x3.0cm  . Cancer     Lung & liver- Dr. Wendie Simmer  . Malignant neoplasm of pharynx, unspecified   . Other and unspecified hyperlipidemia   . Carotid artery disease     sstatus post remote left carotid endarterectomy  . Hyperlipidemia     Medications:  Scheduled:  . sodium chloride   Intravenous STAT  . diltiazem  60 mg Oral Q6H  . levofloxacin (LEVAQUIN) IV  750 mg Intravenous Q24H   Assessment: 79yo male with pneumonia, on Levofloxacin 750mg  IV q24.  Pt with calculated CrCl ~57ml/min.  Dosage adjustment is not necessary at this  point.  Blood & sputum cx are pending.  Goal of Therapy:  resolution of infection  Plan:  Continue Levofloxacin 750mg  IV q24 F/U cultures and watch renal function  Marisue Humble, PharmD Clinical Pharmacist [facility]

## 2013-11-16 NOTE — H&P (Signed)
[facility] History and Physical  ROLF FELLS WGN:[id number] DOB: 1929/02/03 DOA: 11/15/2013  Referring physician: EDP PCP: Bosie Clos, MD   Chief Complaint: Tachycardia   HPI: [name] is a 79 y.o. male who presents with c/o mild dyspnea and tachycardia.  He is unaware of when symptoms began but today he called his cardiologists office who recommended he come into the ED.  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  Review of Systems: Systems reviewed.  As above, otherwise negative  Past Medical History  Diagnosis Date  . Hypertension   . PVD (peripheral vascular disease)     06/27/06 widley patent subclavian vertebrl arteries. right CIA stent  . CAD (coronary artery disease)   . S/P CABG x 3 1997  . Iliac aneurysm     doppler 07/27/12: 3.1x3.0cm  . Cancer     Lung & liver- Dr. Wendie Simmer  . Malignant neoplasm of pharynx, unspecified   . Other and unspecified hyperlipidemia   . Carotid artery disease     sstatus post remote left carotid endarterectomy  . Hyperlipidemia    Past Surgical History  Procedure Laterality Date  . Coronary artery bypass graft  1997  . Carotid endarterectomy      Left  . Cervical discectomy    . Lumbar disc surgery     Social History:  reports that he has never smoked. He does not have any smokeless tobacco history on file. He reports that he does not drink alcohol. His drug history is not on file.  Allergies  Allergen Reactions  . Penicillins Anaphylaxis    Family History  Problem Relation Age of Onset  . Cancer Brother   . Heart failure Father   . Diabetes Sister      Prior to Admission medications   Medication Sig Start Date End Date Taking? Authorizing Provider  cilostazol (PLETAL) 50 MG tablet Take 50 mg by mouth 2 (two) times daily.   Yes Historical Provider, MD  FLUoxetine (PROZAC) 40 MG capsule Take 40 mg by mouth daily.   Yes Historical Provider, MD  gabapentin  (NEURONTIN) 400 MG capsule Take 400 mg by mouth at bedtime.    Yes Historical Provider, MD  HYDROcodone-acetaminophen (NORCO/VICODIN) 5-325 MG per tablet Take 1 tablet by mouth every 4 (four) hours as needed. For pain   Yes Historical Provider, MD  lovastatin (MEVACOR) 40 MG tablet Take 40 mg by mouth at bedtime.   Yes Historical Provider, MD  lubiprostone (AMITIZA) 24 MCG capsule Take 24 mcg by mouth 2 (two) times daily with a meal.   Yes Historical Provider, MD  niacin 500 MG tablet Take 500 mg by mouth daily with breakfast.   Yes Historical Provider, MD  omeprazole (PRILOSEC) 20 MG capsule Take 20 mg by mouth daily.   Yes Historical Provider, MD   Physical Exam: Filed Vitals:   11/16/13 0126  BP: 103/49  Pulse: 95  Temp: 97.8 F (36.6 C)  Resp: 18    BP 103/49  Pulse 95  Temp(Src) 97.8 F (36.6 C) (Oral)  Resp 18  Ht 5\' 11"  (1.803 m)  Wt 66.9 kg (147 lb 7.8 oz)  BMI 20.58 kg/m2  SpO2 97%  General Appearance:    Alert, oriented, no distress, appears stated age  Head:    Normocephalic, atraumatic  Eyes:    PERRL, EOMI, sclera non-icteric        Nose:   Nares without drainage or epistaxis. Mucosa,  turbinates normal  Throat:   Moist mucous membranes. Oropharynx without erythema or exudate.  Neck:   Supple. No carotid bruits.  No thyromegaly.  No lymphadenopathy.   Back:     No CVA tenderness, no spinal tenderness  Lungs:     Clear to auscultation bilaterally, without wheezes, rhonchi or rales  Chest wall:    No tenderness to palpitation  Heart:    Regular rate and rhythm without murmurs, gallops, rubs  Abdomen:     Soft, non-tender, nondistended, normal bowel sounds, no organomegaly  Genitalia:    deferred  Rectal:    deferred  Extremities:   No clubbing, cyanosis or edema.  Pulses:   2+ and symmetric all extremities  Skin:   Skin color, texture, turgor normal, no rashes or lesions  Lymph nodes:   Cervical, supraclavicular, and axillary nodes normal  Neurologic:   CNII-XII  intact. Normal strength, sensation and reflexes      throughout    Labs on Admission:  Basic Metabolic Panel:  Recent Labs Lab 11/15/13 2341  NA 136  K 4.1  CL 103  CO2 24  GLUCOSE 105*  BUN 13  CREATININE 0.60  CALCIUM 7.8*  MG 1.5   Liver Function Tests: No results found for this basename: AST, ALT, ALKPHOS, BILITOT, PROT, ALBUMIN,  in the last 168 hours No results found for this basename: LIPASE, AMYLASE,  in the last 168 hours No results found for this basename: AMMONIA,  in the last 168 hours CBC:  Recent Labs Lab 11/15/13 2341  WBC 6.0  HGB 8.4*  HCT 26.6*  MCV 83.1  PLT 159   Cardiac Enzymes: No results found for this basename: CKTOTAL, CKMB, CKMBINDEX, TROPONINI,  in the last 168 hours  BNP (last 3 results)  Recent Labs  11/15/13 2341  PROBNP 1322.0*   CBG: No results found for this basename: GLUCAP,  in the last 168 hours  Radiological Exams on Admission: Ct Chest Wo Contrast  11/16/2013   CLINICAL DATA:  Mild dyspnea; tachycardia.  EXAM: CT CHEST WITHOUT CONTRAST  TECHNIQUE: Multidetector CT imaging of the chest was performed following the standard protocol without IV contrast.  COMPARISON:  Chest radiograph performed 11/15/2013  FINDINGS: There is a diffusely irregular mass at the right lower lobe, measuring approximately 9.5 x 6.3 x 8.0 cm, with minimal associated calcification. This is compatible with the patient's known primary lung malignancy. Associated nodularity is noted within the surrounding lung; underlying airspace opacity likely reflects post-obstructive pneumonia. There is an associated small right pleural effusion.  Minimal emphysematous change is noted at the right upper lobe. Calcified and noncalcified pleural plaques likely reflect prior asbestos exposure. There is nodularity along the right major fissure, measuring 1.8 cm, compatible with metastatic disease. An 8 mm nodule is noted at the left lung base (image 46 of 73). A small 5 mm  nodule is noted at the periphery of the right middle lobe (image 44 of 73).  Diffuse coronary artery calcifications are seen. The mediastinum is otherwise unremarkable in appearance. There may be right hilar lymphadenopathy, though this is not well assessed without contrast. Scattered calcific atherosclerotic disease is noted along the thoracic aorta and proximal great vessels. No pericardial effusion is identified.  A 1.0 cm hypodensity is noted within the left thyroid lobe. The thyroid gland is otherwise unremarkable in appearance. No axillary lymphadenopathy is seen. A left-sided chest port is noted with its distal tip about the cavoatrial junction. The patient is status post median  sternotomy.  The visualized portions of the liver and spleen are unremarkable in appearance. A few small stones are seen layering dependently within the gallbladder. The visualized portions of the pancreas and adrenal glands are within normal limits, with mild nonspecific stranding about the adrenal glands. A 2.9 cm cyst is noted at the upper pole of the right kidney. Scattered calcific atherosclerotic disease is noted along the proximal abdominal aorta and its branches, including at the origins of both renal arteries.  IMPRESSION: 1. Diffusely irregular mass at the right lower lung lobe, measuring 9.5 x 6.3 x 8.0 cm, with minimal associated calcification. This is compatible with the patient's known primary lung malignancy. 2. Underlying airspace opacity likely reflects post-obstructive pneumonia. Associated small right pleural effusion. 3. Nodularity along the right major fissure, measuring 1.8 cm, compatible with metastatic disease; additional nodules noted at the periphery of the right middle lobe and left lung base, likely reflecting satellite lesions. There may be right hilar lymphadenopathy, though this is not well assessed without contrast. 4. Diffuse coronary artery calcifications seen. 5. Cholelithiasis noted; the visualized  gallbladder otherwise grossly unremarkable. 6. Right renal cyst. 7. Scattered calcific atherosclerotic disease along the proximal abdominal aorta and its branches, including at the origins of both renal arteries.   Electronically Signed   By: Roanna Raider M.D.   On: 11/16/2013 01:12   Dg Chest Port 1 View  11/15/2013   CLINICAL DATA:  Chest pain and shortness of breath.  EXAM: PORTABLE CHEST - 1 VIEW  COMPARISON:  None.  FINDINGS: Patient has a left-sided power port, tip to level of the upper right atrium. Patient has had median sternotomy and CABG. Heart is normal in size.  There is dense opacity involving the right lung base, partially obscuring hemidiaphragm. Left lung is clear.  IMPRESSION: Right lower lobe infiltrate.   Electronically Signed   By: Rosalie Gums M.D.   On: 11/15/2013 22:05    EKG: Independently reviewed.  Assessment/Plan Principal Problem:   Atrial fibrillation with RVR Active Problems:   Cancer of lung   Obstructive pneumonia   1. A.Fib with RVR - rate controlled on cardizem gtt. 2. Obstructive PNA - treating empirically with levaquin 3. Cancer of lung - He isnt sure exactly when his last chemo was but he thinks it was 10 days ago.  He states he receives ongoing chemotherapy at the [facility] in [place]; however, there do not appear to be records in his chart here of that recently (I am unsure if they are on the same EMR system that we are but I would imagine so).  I question if he may be confused as to the dates that this has happened as according to his last office visit note with cardiology on 05/28/13, the patient has completed chemo and radiation including full neuraxis radiation.  However the patient was able to tell me after some thought that it is 2014.  Likely need to get in touch with oncology in the morning to sort this out more clearly.   Code Status: Full Code  Family Communication: No family in room Disposition Plan: Admit to inpatient   Time spent:  70 min  Deiondre Harrower M. [facility] Pager [phone]  If 7AM-7PM, please contact the day team taking care of the patient [email or web address] Password [email or web address] 11/16/2013, 1:44 AM

## 2013-11-16 NOTE — Progress Notes (Signed)
Utilization Review Completed.Annel Zunker T12/11/2013  

## 2013-11-16 NOTE — ED Notes (Signed)
Patient is requesting pain medication.

## 2013-11-16 NOTE — Progress Notes (Signed)
Patient transferred to 4E30 via stretcher from the ED. Patient tranferred from the stretcher to hospital bed in room. Educated patient to the heart failure floor, the room, and call bell system. VSS and is currently on cardizem drip. Patient complains of mild neck pain but pain ED nurse administer fentanyl prior to transfer. Will continue to monitor patient to end of shift.

## 2013-11-17 DIAGNOSIS — I10 Essential (primary) hypertension: Secondary | ICD-10-CM

## 2013-11-17 MED ORDER — PANTOPRAZOLE SODIUM 40 MG PO TBEC
40.0000 mg | DELAYED_RELEASE_TABLET | Freq: Every day | ORAL | Status: DC
  Administered 2013-11-17 – 2013-11-20 (×4): 40 mg via ORAL
  Filled 2013-11-17 (×3): qty 1

## 2013-11-17 MED ORDER — SODIUM CHLORIDE 0.9 % IV SOLN
INTRAVENOUS | Status: DC
  Administered 2013-11-17: 07:00:00 20 mL/h via INTRAVENOUS

## 2013-11-17 MED ORDER — GABAPENTIN 400 MG PO CAPS
400.0000 mg | ORAL_CAPSULE | Freq: Every day | ORAL | Status: DC
  Administered 2013-11-17 – 2013-11-19 (×3): 400 mg via ORAL
  Filled 2013-11-17 (×4): qty 1

## 2013-11-17 MED ORDER — HYDROCODONE-ACETAMINOPHEN 5-325 MG PO TABS
1.0000 | ORAL_TABLET | ORAL | Status: DC | PRN
  Administered 2013-11-17 – 2013-11-20 (×10): 1 via ORAL
  Filled 2013-11-17 (×10): qty 1

## 2013-11-17 MED ORDER — GUAIFENESIN-DM 100-10 MG/5ML PO SYRP
5.0000 mL | ORAL_SOLUTION | ORAL | Status: DC | PRN
  Administered 2013-11-17: 12:00:00 5 mL via ORAL
  Filled 2013-11-17: qty 5

## 2013-11-17 MED ORDER — HEPARIN SODIUM (PORCINE) 5000 UNIT/ML IJ SOLN
5000.0000 [IU] | Freq: Three times a day (TID) | INTRAMUSCULAR | Status: AC
  Administered 2013-11-17 – 2013-11-19 (×7): 5000 [IU] via SUBCUTANEOUS
  Filled 2013-11-17 (×9): qty 1

## 2013-11-17 MED ORDER — FLUOXETINE HCL 20 MG PO CAPS
40.0000 mg | ORAL_CAPSULE | Freq: Every day | ORAL | Status: DC
  Administered 2013-11-17 – 2013-11-20 (×4): 40 mg via ORAL
  Filled 2013-11-17 (×4): qty 2

## 2013-11-17 MED ORDER — CILOSTAZOL 50 MG PO TABS
50.0000 mg | ORAL_TABLET | Freq: Two times a day (BID) | ORAL | Status: DC
  Administered 2013-11-17 – 2013-11-20 (×7): 50 mg via ORAL
  Filled 2013-11-17 (×8): qty 1

## 2013-11-17 NOTE — Progress Notes (Signed)
[facility] PROGRESS NOTE Interim History: 79 y.o. male who presents with c/o mild dyspnea and tachycardia. He is unaware of when symptoms began but today he called his cardiologists office who recommended he come into the ED. 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 Found to have post obstructive pNA with A. Fib with RVR  Assessment/Plan: Sinus tachycardia: - Due to PNA and dehydration. - rate controlled on diltiazem drip, change to otral, d/c heparin not on a. fib. - now 70's.  Obstructive pneumonia/right effusion: - Change antibiotics to rocephin and azithro. - Fortunately effusion not loculated. - Ct chest showed 12.13.2014: right lower lung lobe, measuring 9.5 x 6.3 x 8.0 cm, Underlying airspace opacity  Associated with effusion. additional nodules noted at the periphery of the right middle lobe and left lung base   Cancer of lung: - Consult oncology on 12.15.2014. - obtain records from oncologist.    Code Status: Full Code  Family Communication: No family in room  Disposition Plan: Admit to inpatient     Consultants:  None  Procedures:  CT chest  Antibiotics: Rocephin and azithro HPI/Subjective: Relates some SOB, feels better than yesterday.   Objective: Filed Vitals:   11/16/13 1317 11/16/13 1702 11/16/13 2021 11/17/13 0455  BP: 102/49  129/53 105/49  Pulse: 87  91 85  Temp: 97.6 F (36.4 C) 97.2 F (36.2 C) 98.4 F (36.9 C) 98.8 F (37.1 C)  TempSrc: Oral  Oral Oral  Resp: 18  20 20   Height:      Weight:    66.679 kg (147 lb)  SpO2: 94%  96% 97%    Intake/Output Summary (Last 24 hours) at 11/17/13 1047 Last data filed at 11/17/13 0919  Gross per 24 hour  Intake    360 ml  Output    600 ml  Net   -240 ml   Filed Weights   11/16/13 0126 11/17/13 0455  Weight: 66.9 kg (147 lb 7.8 oz) 66.679 kg (147 lb)    Exam:  General: Alert, awake, oriented x3, in no acute distress.  HEENT: No bruits, no  goiter.  Heart: Regular rate and rhythm, without murmurs, rubs, gallops.  Lungs: Good air movement, crackles on Right. Abdomen: Soft, nontender, nondistended, positive bowel sounds.     Data Reviewed: Basic Metabolic Panel:  Recent Labs Lab 11/15/13 2341  NA 136  K 4.1  CL 103  CO2 24  GLUCOSE 105*  BUN 13  CREATININE 0.60  CALCIUM 7.8*  MG 1.5   Liver Function Tests: No results found for this basename: AST, ALT, ALKPHOS, BILITOT, PROT, ALBUMIN,  in the last 168 hours No results found for this basename: LIPASE, AMYLASE,  in the last 168 hours No results found for this basename: AMMONIA,  in the last 168 hours CBC:  Recent Labs Lab 11/15/13 2341  WBC 6.0  HGB 8.4*  HCT 26.6*  MCV 83.1  PLT 159   Cardiac Enzymes: No results found for this basename: CKTOTAL, CKMB, CKMBINDEX, TROPONINI,  in the last 168 hours BNP (last 3 results)  Recent Labs  11/15/13 2341  PROBNP 1322.0*   CBG: No results found for this basename: GLUCAP,  in the last 168 hours  No results found for this or any previous visit (from the past 240 hour(s)).   Studies: Ct Chest Wo Contrast  11/16/2013   CLINICAL DATA:  Mild dyspnea; tachycardia.  EXAM: CT CHEST WITHOUT CONTRAST  TECHNIQUE: Multidetector CT imaging of the  chest was performed following the standard protocol without IV contrast.  COMPARISON:  Chest radiograph performed 11/15/2013  FINDINGS: There is a diffusely irregular mass at the right lower lobe, measuring approximately 9.5 x 6.3 x 8.0 cm, with minimal associated calcification. This is compatible with the patient's known primary lung malignancy. Associated nodularity is noted within the surrounding lung; underlying airspace opacity likely reflects post-obstructive pneumonia. There is an associated small right pleural effusion.  Minimal emphysematous change is noted at the right upper lobe. Calcified and noncalcified pleural plaques likely reflect prior asbestos exposure. There is  nodularity along the right major fissure, measuring 1.8 cm, compatible with metastatic disease. An 8 mm nodule is noted at the left lung base (image 46 of 73). A small 5 mm nodule is noted at the periphery of the right middle lobe (image 44 of 73).  Diffuse coronary artery calcifications are seen. The mediastinum is otherwise unremarkable in appearance. There may be right hilar lymphadenopathy, though this is not well assessed without contrast. Scattered calcific atherosclerotic disease is noted along the thoracic aorta and proximal great vessels. No pericardial effusion is identified.  A 1.0 cm hypodensity is noted within the left thyroid lobe. The thyroid gland is otherwise unremarkable in appearance. No axillary lymphadenopathy is seen. A left-sided chest port is noted with its distal tip about the cavoatrial junction. The patient is status post median sternotomy.  The visualized portions of the liver and spleen are unremarkable in appearance. A few small stones are seen layering dependently within the gallbladder. The visualized portions of the pancreas and adrenal glands are within normal limits, with mild nonspecific stranding about the adrenal glands. A 2.9 cm cyst is noted at the upper pole of the right kidney. Scattered calcific atherosclerotic disease is noted along the proximal abdominal aorta and its branches, including at the origins of both renal arteries.  IMPRESSION: 1. Diffusely irregular mass at the right lower lung lobe, measuring 9.5 x 6.3 x 8.0 cm, with minimal associated calcification. This is compatible with the patient's known primary lung malignancy. 2. Underlying airspace opacity likely reflects post-obstructive pneumonia. Associated small right pleural effusion. 3. Nodularity along the right major fissure, measuring 1.8 cm, compatible with metastatic disease; additional nodules noted at the periphery of the right middle lobe and left lung base, likely reflecting satellite lesions. There  may be right hilar lymphadenopathy, though this is not well assessed without contrast. 4. Diffuse coronary artery calcifications seen. 5. Cholelithiasis noted; the visualized gallbladder otherwise grossly unremarkable. 6. Right renal cyst. 7. Scattered calcific atherosclerotic disease along the proximal abdominal aorta and its branches, including at the origins of both renal arteries.   Electronically Signed   By: Roanna Raider M.D.   On: 11/16/2013 01:12   Dg Chest Port 1 View  11/15/2013   CLINICAL DATA:  Chest pain and shortness of breath.  EXAM: PORTABLE CHEST - 1 VIEW  COMPARISON:  None.  FINDINGS: Patient has a left-sided power port, tip to level of the upper right atrium. Patient has had median sternotomy and CABG. Heart is normal in size.  There is dense opacity involving the right lung base, partially obscuring hemidiaphragm. Left lung is clear.  IMPRESSION: Right lower lobe infiltrate.   Electronically Signed   By: Rosalie Gums M.D.   On: 11/15/2013 22:05    Scheduled Meds: . diltiazem  60 mg Oral Q6H  . levofloxacin (LEVAQUIN) IV  750 mg Intravenous Q24H   Continuous Infusions:     [name],  [facility]  [facility] Pager [phone].  If 8PM-8AM, please contact night-coverage at [email or web address], password [facility] 11/17/2013, 10:47 AM  LOS: 2 days

## 2013-11-17 NOTE — Progress Notes (Signed)
PRN given for granulized pain. Will continue to monitor

## 2013-11-17 NOTE — Progress Notes (Signed)
Pt on continuous   O2 monitor per order. Will continue to monitor

## 2013-11-17 NOTE — Progress Notes (Addendum)
Pt O4x, Pt requesting med for pain. Refusing morphine, md to change to home med. Pt states last BM 12-8. Prune juice giving. 5 beat run of VT @ 245 12-13. Will continue to monitor

## 2013-11-18 MED ORDER — POLYETHYLENE GLYCOL 3350 17 G PO PACK
17.0000 g | PACK | Freq: Every day | ORAL | Status: DC
  Administered 2013-11-18 – 2013-11-20 (×3): 17 g via ORAL
  Filled 2013-11-18 (×3): qty 1

## 2013-11-18 MED ORDER — BIOTENE DRY MOUTH MT LIQD
15.0000 mL | Freq: Two times a day (BID) | OROMUCOSAL | Status: DC
  Administered 2013-11-18 – 2013-11-20 (×5): 15 mL via OROMUCOSAL

## 2013-11-18 NOTE — Progress Notes (Addendum)
[facility] PROGRESS NOTE Interim History: 79 y.o. male who presents with c/o mild dyspnea and tachycardia. He is unaware of when symptoms began but today he called his cardiologists office who recommended he come into the ED. 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 Found to have post obstructive pNA with A. Fib with RVR  Assessment/Plan: Sinus tachycardia: - Due to PNA and dehydration. - rate controlled on diltiazem drip, change to oral, d/c heparin not on a. fib. - now 70's.  Obstructive pneumonia/right effusion: - Change antibiotics to rocephin and azithro. Change to levaquin on 12.15.2014. - Fortunately effusion not loculated. - Ct chest showed 12.13.2014: right lower lung lobe, measuring 9.5 x 6.3 x 8.0 cm, Underlying airspace opacity  Associated with effusion. additional nodules noted at the periphery of the right middle lobe and left lung base  Cancer of lung: - Consult oncology on 12.15.2014. - obtain records from oncologist.    Code Status: DNR Family Communication: No family in room  Disposition Plan: Admit to inpatient     Consultants:  None  Procedures:  CT chest  Antibiotics: Rocephin and azithro HPI/Subjective:  Feels close to baseline.   Objective: Filed Vitals:   11/17/13 1453 11/17/13 2053 11/18/13 0537 11/18/13 0945  BP: 102/44 107/48 126/63 100/56  Pulse: 79 78 82 83  Temp: 98.3 F (36.8 C) 97 F (36.1 C) 97.3 F (36.3 C)   TempSrc: Oral Oral Oral   Resp: 18 18 18 16   Height:      Weight:   67.9 kg (149 lb 11.1 oz)   SpO2: 95% 97% 95% 100%    Intake/Output Summary (Last 24 hours) at 11/18/13 1210 Last data filed at 11/18/13 0900  Gross per 24 hour  Intake   1180 ml  Output   1050 ml  Net    130 ml   Filed Weights   11/16/13 0126 11/17/13 0455 11/18/13 0537  Weight: 66.9 kg (147 lb 7.8 oz) 66.679 kg (147 lb) 67.9 kg (149 lb 11.1 oz)    Exam:  General: Alert, awake, oriented x3, in  no acute distress.  HEENT: No bruits, no goiter.  Heart: Regular rate and rhythm, without murmurs, rubs, gallops.  Lungs: Good air movement, crackles on Right. Abdomen: Soft, nontender, nondistended, positive bowel sounds.     Data Reviewed: Basic Metabolic Panel:  Recent Labs Lab 11/15/13 2341  NA 136  K 4.1  CL 103  CO2 24  GLUCOSE 105*  BUN 13  CREATININE 0.60  CALCIUM 7.8*  MG 1.5   Liver Function Tests: No results found for this basename: AST, ALT, ALKPHOS, BILITOT, PROT, ALBUMIN,  in the last 168 hours No results found for this basename: LIPASE, AMYLASE,  in the last 168 hours No results found for this basename: AMMONIA,  in the last 168 hours CBC:  Recent Labs Lab 11/15/13 2341  WBC 6.0  HGB 8.4*  HCT 26.6*  MCV 83.1  PLT 159   Cardiac Enzymes: No results found for this basename: CKTOTAL, CKMB, CKMBINDEX, TROPONINI,  in the last 168 hours BNP (last 3 results)  Recent Labs  11/15/13 2341  PROBNP 1322.0*   CBG: No results found for this basename: GLUCAP,  in the last 168 hours  Recent Results (from the past 240 hour(s))  CULTURE, BLOOD (ROUTINE X 2)     Status: None   Collection Time    11/16/13  2:30 AM      Result Value Range  Status   Specimen Description BLOOD RIGHT ARM   Final   Special Requests BOTTLES DRAWN AEROBIC ONLY [facility]   Final   Culture  Setup Time     Final   Value: 11/16/2013 08:52     Performed at [facility]   Culture     Final   Value:        BLOOD CULTURE RECEIVED NO GROWTH TO DATE CULTURE WILL BE HELD FOR 5 DAYS BEFORE ISSUING A FINAL NEGATIVE REPORT     Performed at [facility]   Report Status PENDING   Incomplete  CULTURE, BLOOD (ROUTINE X 2)     Status: None   Collection Time    11/16/13  2:40 AM      Result Value Range Status   Specimen Description BLOOD RIGHT HAND   Final   Special Requests BOTTLES DRAWN AEROBIC ONLY 1CC   Final   Culture  Setup Time     Final   Value: 11/16/2013 08:49     Performed  at [facility]   Culture     Final   Value:        BLOOD CULTURE RECEIVED NO GROWTH TO DATE CULTURE WILL BE HELD FOR 5 DAYS BEFORE ISSUING A FINAL NEGATIVE REPORT     Performed at [facility]   Report Status PENDING   Incomplete     Studies: No results found.  Scheduled Meds: . antiseptic oral rinse  15 mL Mouth Rinse BID  . cilostazol  50 mg Oral BID  . diltiazem  60 mg Oral Q6H  . FLUoxetine  40 mg Oral Daily  . gabapentin  400 mg Oral QHS  . heparin subcutaneous  5,000 Units Subcutaneous Q8H  . levofloxacin (LEVAQUIN) IV  750 mg Intravenous Q24H  . pantoprazole  40 mg Oral Daily  . polyethylene glycol  17 g Oral Daily   Continuous Infusions: . sodium chloride 20 mL/hr (11/17/13 0700)     Marinda Elk  [facility] Pager [phone].  If 8PM-8AM, please contact night-coverage at [email or web address], password [facility] 11/18/2013, 12:10 PM  LOS: 3 days

## 2013-11-18 NOTE — Progress Notes (Signed)
1800 Resting comfortably in bed 

## 2013-11-19 ENCOUNTER — Other Ambulatory Visit: Payer: Self-pay

## 2013-11-19 DIAGNOSIS — M542 Cervicalgia: Secondary | ICD-10-CM

## 2013-11-19 DIAGNOSIS — R0609 Other forms of dyspnea: Secondary | ICD-10-CM

## 2013-11-19 DIAGNOSIS — C787 Secondary malignant neoplasm of liver and intrahepatic bile duct: Secondary | ICD-10-CM

## 2013-11-19 MED ORDER — DILTIAZEM HCL 90 MG PO TABS
90.0000 mg | ORAL_TABLET | Freq: Four times a day (QID) | ORAL | Status: DC
  Administered 2013-11-19 – 2013-11-20 (×5): 90 mg via ORAL
  Filled 2013-11-19 (×9): qty 1

## 2013-11-19 MED ORDER — RIVAROXABAN 20 MG PO TABS
20.0000 mg | ORAL_TABLET | Freq: Every day | ORAL | Status: DC
  Administered 2013-11-19: 17:00:00 20 mg via ORAL
  Filled 2013-11-19 (×2): qty 1

## 2013-11-19 MED ORDER — SORBITOL 70 % SOLN
30.0000 mL | Freq: Once | Status: AC
  Administered 2013-11-19: 13:00:00 30 mL via ORAL
  Filled 2013-11-19: qty 30

## 2013-11-19 MED ORDER — RIVAROXABAN (XARELTO) EDUCATION KIT FOR DVT/PE PATIENTS
PACK | Freq: Once | Status: DC
  Filled 2013-11-19 (×2): qty 1

## 2013-11-19 NOTE — Progress Notes (Signed)
ANTICOAGULATION CONSULT NOTE - Initial Consult  Pharmacy Consult for Xarelto  Indication: atrial fibrillation  Allergies  Allergen Reactions  . Penicillins Anaphylaxis    Patient Measurements: Height: 5\' 11"  (180.3 cm) Weight: 151 lb 0.2 oz (68.5 kg) (bed scale ) IBW/kg (Calculated) : 75.3  Vital Signs: Temp: 97.8 F (36.6 C) (12/15 0545) Temp src: Oral (12/15 0545) BP: 106/53 mmHg (12/15 0934) Pulse Rate: 87 (12/15 0934)  Labs: No results found for this basename: HGB, HCT, PLT, APTT, LABPROT, INR, HEPARINUNFRC, CREATININE, CKTOTAL, CKMB, TROPONINI,  in the last 72 hours  Estimated Creatinine Clearance: 69 ml/min (by C-G formula based on Cr of 0.6).   Medical History: Past Medical History  Diagnosis Date  . Hypertension   . PVD (peripheral vascular disease)     06/27/06 widley patent subclavian vertebrl arteries. right CIA stent  . CAD (coronary artery disease)   . S/P CABG x 3 1997  . Iliac aneurysm     doppler 07/27/12: 3.1x3.0cm  . Cancer     Lung & liver- Dr. Wendie Simmer  . Malignant neoplasm of pharynx, unspecified   . Other and unspecified hyperlipidemia   . Carotid artery disease     sstatus post remote left carotid endarterectomy  . Hyperlipidemia    Assessment:  34 yoM admitted with mild SOB and tachycardia. Pharmacy consulted to dose for non-valvular Afib with RVR, controlled on diltiazem  HR 106/53 HR 87 CrCl 69 SCr 0.60 Plts wnl  Goal of Therapy:  Monitor platelets by anticoagulation protocol: Yes   Plan:  Xarelto 20mg  daily with supper Dose appropriate, adjust with renal function  Jodie Cavey B. Artelia Laroche, PharmD Clinical Pharmacist - Resident Pager: [phone] Phone: [phone] 11/19/2013 12:43 PM

## 2013-11-19 NOTE — Progress Notes (Signed)
Utilization Review Completed Margan Elias J. Tymel Conely, RN, BSN, NCM [phone]  

## 2013-11-19 NOTE — Progress Notes (Signed)
Pt has a RFA IV dated the 12th not doc in epic, now doc on epic, placement time unknown.

## 2013-11-19 NOTE — Discharge Instructions (Signed)
Information on my medicine - XARELTO (Rivaroxaban)  This medication education was reviewed with me or my healthcare representative as part of my discharge preparation.  The pharmacist that spoke with me during my hospital stay was:  [facility]  Why was Xarelto prescribed for you? Xarelto was prescribed for you to reduce the risk of a blood clots forming after orthopedic surgery OR to reduce the risk of forming blood clots that cause a stroke if you have a medical condition called atrial fibrillation (a type of irregular heartbeat).  What do you need to know about xarelto ? Take your Xarelto ONCE DAILY at the same time every day with your evening meal. If you have difficulty swallowing the tablet whole, you may crush it and mix in applesauce just prior to taking your dose.  Take Xarelto exactly as prescribed by your doctor and DO NOT stop taking Xarelto without talking to the doctor who prescribed the medication.  Stopping without other stroke or VTE prevention medication to take the place of Xarelto may increase your risk of developing a new clot or stroke.  Refill your prescription before you run out.  After discharge, you should have regular check-up appointments with your healthcare provider that is prescribing your Xarelto.  In the future your dose may need to be changed if your kidney function or weight changes by a significant amount.  What do you do if you miss a dose? If you are taking Xarelto ONCE DAILY and you miss a dose, take it as soon as you remember on the same day then continue your regularly scheduled once daily regimen the next day. Do not take two doses of Xarelto at the same time.   Important Safety Information A possible side effect of Xarelto is bleeding. You should call your healthcare provider right away if you experience any of the following:   Bleeding from an injury or your nose that does not stop.   Unusual colored urine (red or dark brown) or  unusual colored stools (red or black).   Unusual bruising for unknown reasons.   A serious fall or if you hit your head (even if there is no bleeding).  Some medicines may interact with Xarelto and might increase your risk of bleeding while on Xarelto. To help avoid this, consult your healthcare provider or pharmacist prior to using any new prescription or non-prescription medications, including herbals, vitamins, non-steroidal anti-inflammatory drugs (NSAIDs) and supplements.  This website has more information on Xarelto: [email or web address].br.

## 2013-11-19 NOTE — Progress Notes (Signed)
Family requesting suppository , enma etc, for Pt to have a BM> Family states last chemo txt was 2wk ago,

## 2013-11-19 NOTE — Consult Note (Signed)
[facility] [phone]   [phone]  INPATIENT CONSULT NOTE  REFERRING PHYSICIAN: Hillary Bow, MD  REASON FOR CONSULTATION:  Metastatic Lung Cancer  HPI [name] is a 79 y.o. male with a history of lung cancer since 2013.  He admitted with mild dyspnea and tachycardia.  He was instructed by his cardiologist to report for further evaluation.  He was found to have atrial fibrillation with rVR and now rate controlled with cardiazem gtt and also had obstructive pneumonia being treated with levaquin.  We are being consulted for recommendations regarding his lung cancer.  We contacted his primary oncologist Dr. Lonia Chimera Ramiah's office.  She sent documents from her office [facility], [address] [place], [place] [address]).  He was initially diagnosed with pneumonia on 06/28/2012.  He was treated with prednisone and ciprofloxacin with some improvement but a repeat chest x-ray on 08/14/2012 noted a heterogeneous opacity in the right lower lobe.  A CT scan was done on 08/22/2012 showing a mass-like density in the infrahilar right lower lobe approximately 3.8 x 5.8 cm with possible lesion in the right lobe of the liver (3.27 cm in diameter).  A liver biopsy on 09/14/2012 confirmed small cell lung cancer (pathology not reviewed.).    He was started on carbo/etoposide on 09/18/2012 and completed 4 cycles on 12/13 with good response to therapy.  Therapy was interrupted due to receipt of PCI on 01/2013.  He had progressive disease and was started on second line therapy with topotecan. He has required multiple interruptions of therapy due to issues with neck pain.  He last received chemotherapy on 12/08.  He was scheduled to have it on today and 12/22 and 12/29.  Repeat CT chest was planned on January 2nd following completion of 10 weeks of therapy.    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  He received radiation therapy.  He also has a history of hypothyroidism, coronary artery disease and peripheral neuropathy.  He has increased calcium secondary to malignancy and was on monthly zometa.  He has a scheduled follow-up Dr Shyrl Numbers on 11/26/2013.   HPI  Past Medical History  Diagnosis Date  . Hypertension   . PVD (peripheral vascular disease)     06/27/06 widley patent subclavian vertebrl arteries. right CIA stent  . CAD (coronary artery disease)   . S/P CABG x 3 1997  . Iliac aneurysm     doppler 07/27/12: 3.1x3.0cm  . Cancer     Lung & liver- Dr. Wendie Simmer  . Malignant neoplasm of pharynx, unspecified   . Other and unspecified hyperlipidemia   . Carotid artery disease     sstatus post remote left carotid endarterectomy  . Hyperlipidemia     Past Surgical History  Procedure Laterality Date  . Coronary artery bypass graft  1997  . Carotid endarterectomy      Left  . Cervical discectomy    . Lumbar disc surgery      Family History  Problem Relation Age of Onset  . Cancer Brother   . Heart failure Father   . Diabetes Sister     Social History History  Substance Use Topics  . Smoking status: Never Smoker   . Smokeless tobacco: Not on file  . Alcohol Use: No    Allergies  Allergen Reactions  . Penicillins Anaphylaxis    Current Facility-Administered Medications  Medication  Dose Route Frequency Provider Last Rate Last Dose  . 0.9 %  sodium chloride infusion   Intravenous Continuous Marinda Elk, MD 20 mL/hr at 11/17/13 0700 20 mL/hr at 11/17/13 0700  . antiseptic oral rinse (BIOTENE) solution 15 mL  15 mL Mouth Rinse BID Marinda Elk, MD   15 mL at 11/19/13 0800  . cilostazol (PLETAL) tablet 50 mg  50 mg Oral BID Marinda Elk, MD   50 mg at 11/19/13 1610  . diltiazem (CARDIZEM) tablet 90 mg  90 mg Oral Q6H Marinda Elk, MD   90 mg at 11/19/13 1318  . FLUoxetine (PROZAC) capsule 40 mg  40 mg Oral Daily Marinda Elk, MD   40 mg at 11/19/13 0929  . gabapentin (NEURONTIN) capsule 400 mg  400 mg Oral QHS Marinda Elk, MD   400 mg at 11/18/13 2206  . guaiFENesin-dextromethorphan (ROBITUSSIN DM) 100-10 MG/5ML syrup 5 mL  5 mL Oral Q4H PRN Marinda Elk, MD   5 mL at 11/17/13 1223  . HYDROcodone-acetaminophen (NORCO/VICODIN) 5-325 MG per tablet 1 tablet  1 tablet Oral Q4H PRN Marinda Elk, MD   1 tablet at 11/19/13 1153  . levofloxacin (LEVAQUIN) IVPB 750 mg  750 mg Intravenous Q24H Marinda Elk, MD   750 mg at 11/18/13 2206  . pantoprazole (PROTONIX) EC tablet 40 mg  40 mg Oral Daily Marinda Elk, MD   40 mg at 11/19/13 0932  . polyethylene glycol (MIRALAX / GLYCOLAX) packet 17 g  17 g Oral Daily Marinda Elk, MD   17 g at 11/19/13 0929  . rivaroxaban Carlena Hurl) patient education kit   Does not apply Once Marinda Elk, MD      . Rivaroxaban Carlena Hurl) tablet 20 mg  20 mg Oral Q supper [facility], RPH      . sodium chloride 0.9 % injection 10-40 mL  10-40 mL Intracatheter PRN Marinda Elk, MD   20 mL at 11/16/13 1150    Review of Systems  A comprehensive review of systems was negative except for: Gastrointestinal: positive for constipation  Physical Exam  RUE:AVWUJ, comfortable and ill looking SKIN: no rashes or significant lesions HEAD: Normocephalic EYES: PERRLA, EOMI EARS: External ears normal OROPHARYNX:no exudate and no erythema  NECK: supple, no adenopathy LYMPH:  not examined LUNGS: clear to auscultation  HEART: irregularly irregular ABDOMEN:abdomen soft, non-tender and normal bowel sounds EXTREMITIES:no edema, no clubbing, no cyanosis  NEURO: alert & oriented x 3 with fluent speech, no focal motor/sensory deficits  PERFORMANCE STATUS: ECOG 2  LABORATORY DATA: Lab Results  Component Value Date   WBC 6.0 11/15/2013   HGB 8.4* 11/15/2013   HCT 26.6* 11/15/2013   MCV 83.1 11/15/2013   PLT 159 11/15/2013    RADIOGRAPHIC  STUDIES: Ct Chest Wo Contrast  11/16/2013   CLINICAL DATA:  Mild dyspnea; tachycardia.  EXAM: CT CHEST WITHOUT CONTRAST  TECHNIQUE: Multidetector CT imaging of the chest was performed following the standard protocol without IV contrast.  COMPARISON:  Chest radiograph performed 11/15/2013  FINDINGS: There is a diffusely irregular mass at the right lower lobe, measuring approximately 9.5 x 6.3 x 8.0 cm, with minimal associated calcification. This is compatible with the patient's known primary lung malignancy. Associated nodularity is noted within the surrounding lung; underlying airspace opacity likely reflects post-obstructive pneumonia. There is an associated small right pleural effusion.  Minimal emphysematous change is noted at the right upper lobe. Calcified and  noncalcified pleural plaques likely reflect prior asbestos exposure. There is nodularity along the right major fissure, measuring 1.8 cm, compatible with metastatic disease. An 8 mm nodule is noted at the left lung base (image 46 of 73). A small 5 mm nodule is noted at the periphery of the right middle lobe (image 44 of 73).  Diffuse coronary artery calcifications are seen. The mediastinum is otherwise unremarkable in appearance. There may be right hilar lymphadenopathy, though this is not well assessed without contrast. Scattered calcific atherosclerotic disease is noted along the thoracic aorta and proximal great vessels. No pericardial effusion is identified.  A 1.0 cm hypodensity is noted within the left thyroid lobe. The thyroid gland is otherwise unremarkable in appearance. No axillary lymphadenopathy is seen. A left-sided chest port is noted with its distal tip about the cavoatrial junction. The patient is status post median sternotomy.  The visualized portions of the liver and spleen are unremarkable in appearance. A few small stones are seen layering dependently within the gallbladder. The visualized portions of the pancreas and adrenal glands  are within normal limits, with mild nonspecific stranding about the adrenal glands. A 2.9 cm cyst is noted at the upper pole of the right kidney. Scattered calcific atherosclerotic disease is noted along the proximal abdominal aorta and its branches, including at the origins of both renal arteries.  IMPRESSION: 1. Diffusely irregular mass at the right lower lung lobe, measuring 9.5 x 6.3 x 8.0 cm, with minimal associated calcification. This is compatible with the patient's known primary lung malignancy. 2. Underlying airspace opacity likely reflects post-obstructive pneumonia. Associated small right pleural effusion. 3. Nodularity along the right major fissure, measuring 1.8 cm, compatible with metastatic disease; additional nodules noted at the periphery of the right middle lobe and left lung base, likely reflecting satellite lesions. There may be right hilar lymphadenopathy, though this is not well assessed without contrast. 4. Diffuse coronary artery calcifications seen. 5. Cholelithiasis noted; the visualized gallbladder otherwise grossly unremarkable. 6. Right renal cyst. 7. Scattered calcific atherosclerotic disease along the proximal abdominal aorta and its branches, including at the origins of both renal arteries.   Electronically Signed   By: Roanna Raider M.D.   On: 11/16/2013 01:12   Dg Chest Port 1 View  11/15/2013   CLINICAL DATA:  Chest pain and shortness of breath.  EXAM: PORTABLE CHEST - 1 VIEW  COMPARISON:  None.  FINDINGS: Patient has a left-sided power port, tip to level of the upper right atrium. Patient has had median sternotomy and CABG. Heart is normal in size.  There is dense opacity involving the right lung base, partially obscuring hemidiaphragm. Left lung is clear.  IMPRESSION: Right lower lobe infiltrate.   Electronically Signed   By: Rosalie Gums M.D.   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 Progressive SCLC w mets to liver. --Continue to hold topotecan while inpatient.   --Notify primary oncologist (Dr. Wendie Simmer ) of admission and copy of discharge.  Planned follow on 11/26/2013.  --Supportive care for constipation  2. Afib with RVR. --Continue to primary team.   3. Obstructive pneumonia --Complete course of empirical Levaquin.   4. Neck pain. --Recent Brain MRI negative for disease.  Patient has disc degenerative disease.  Pain improved on Tramadol.   5. History of increased calcium secondary #1.  --  Zometa as an outpatient.   The patient voices understanding of current disease status and treatment options and is in agreement with the current care plan.  All questions were answered. The patient knows to call the clinic with any problems, questions or concerns. We can certainly see the patient much sooner if necessary.  Thank you so much for allowing me to participate in the care of [name]. I will continue to follow up the patient with you and assist in his care.  I spent 25 minutes counseling the patient face to face. The total time spent in the appointment was 45 minutes.  [name] 11/19/2013, 2:51 PM

## 2013-11-19 NOTE — Progress Notes (Addendum)
Pt o4x, lying in bed. Pt complaining of generalized pain and pain in RT n LT feet, Pt states makes legs jerk, Pt states he has eye pain occasionally .Pt ask Rn what is causing it, Rn informed Pt he will inform MD. PRN given for pain.Pt last BM 12-8, bowel sounds active Pt states he is passing gas

## 2013-11-19 NOTE — Progress Notes (Addendum)
[facility] PROGRESS NOTE Interim History: 79 y.o. male who presents with c/o mild dyspnea and tachycardia. He is unaware of when symptoms began but today he called his cardiologists office who recommended he come into the ED. 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 Found to have post obstructive pNA with A. Fib with RVR  Assessment/Plan: Obstructive pneumonia/right effusion: - Change antibiotics to rocephin and azithro. Change to levaquin on 12.15.2014. - Fortunately effusion not loculated. - afebrile sat > 90% on RA. - Ct chest showed 12.13.2014: Right lower lung lobe, measuring 9.5 x 6.3 x 8.0 cm, Underlying airspace opacity  Associated with effusion. additional nodules noted at the periphery of the right middle lobe and left lung base  A fib - check and EKG irregular rhythm on telemetry. - Increased diltiazem. Start xarelto. - now 70's.  Cancer of lung: - Consult oncology on 12.15.2014. - obtain records from oncologist.    Code Status: Full Code  Family Communication: No family in room  Disposition Plan: Admit to inpatient     Consultants:  None  Procedures:  CT chest  Antibiotics: Rocephin and azithro levaquin on 12.15.2014. HPI/Subjective: Feels close to baseline.   Objective: Filed Vitals:   11/18/13 1500 11/18/13 2027 11/19/13 0545 11/19/13 0934  BP: 103/62 115/47 107/43 106/53  Pulse: 80 97 86 87  Temp: 97.3 F (36.3 C) 97.7 F (36.5 C) 97.8 F (36.6 C)   TempSrc: Tympanic Oral Oral   Resp: 18 20 20 20   Height:      Weight:   68.5 kg (151 lb 0.2 oz)   SpO2: 100% 96% 95% 95%    Intake/Output Summary (Last 24 hours) at 11/19/13 1133 Last data filed at 11/19/13 0843  Gross per 24 hour  Intake 1474.33 ml  Output   1125 ml  Net 349.33 ml   Filed Weights   11/17/13 0455 11/18/13 0537 11/19/13 0545  Weight: 66.679 kg (147 lb) 67.9 kg (149 lb 11.1 oz) 68.5 kg (151 lb 0.2 oz)    Exam:  General:  Alert, awake, oriented x3, in no acute distress.  HEENT: No bruits, no goiter.  Heart: Regular rate and rhythm, without murmurs, rubs, gallops.  Lungs: Good air movement, crackles on Right. Abdomen: Soft, nontender, nondistended, positive bowel sounds.     Data Reviewed: Basic Metabolic Panel:  Recent Labs Lab 11/15/13 2341  NA 136  K 4.1  CL 103  CO2 24  GLUCOSE 105*  BUN 13  CREATININE 0.60  CALCIUM 7.8*  MG 1.5   Liver Function Tests: No results found for this basename: AST, ALT, ALKPHOS, BILITOT, PROT, ALBUMIN,  in the last 168 hours No results found for this basename: LIPASE, AMYLASE,  in the last 168 hours No results found for this basename: AMMONIA,  in the last 168 hours CBC:  Recent Labs Lab 11/15/13 2341  WBC 6.0  HGB 8.4*  HCT 26.6*  MCV 83.1  PLT 159   Cardiac Enzymes: No results found for this basename: CKTOTAL, CKMB, CKMBINDEX, TROPONINI,  in the last 168 hours BNP (last 3 results)  Recent Labs  11/15/13 2341  PROBNP 1322.0*   CBG: No results found for this basename: GLUCAP,  in the last 168 hours  Recent Results (from the past 240 hour(s))  CULTURE, BLOOD (ROUTINE X 2)     Status: None   Collection Time    11/16/13  2:30 AM      Result Value Range Status  Specimen Description BLOOD RIGHT ARM   Final   Special Requests BOTTLES DRAWN AEROBIC ONLY [facility]   Final   Culture  Setup Time     Final   Value: 11/16/2013 08:52     Performed at [facility]   Culture     Final   Value:        BLOOD CULTURE RECEIVED NO GROWTH TO DATE CULTURE WILL BE HELD FOR 5 DAYS BEFORE ISSUING A FINAL NEGATIVE REPORT     Performed at [facility]   Report Status PENDING   Incomplete  CULTURE, BLOOD (ROUTINE X 2)     Status: None   Collection Time    11/16/13  2:40 AM      Result Value Range Status   Specimen Description BLOOD RIGHT HAND   Final   Special Requests BOTTLES DRAWN AEROBIC ONLY 1CC   Final   Culture  Setup Time     Final   Value:  11/16/2013 08:49     Performed at [facility]   Culture     Final   Value:        BLOOD CULTURE RECEIVED NO GROWTH TO DATE CULTURE WILL BE HELD FOR 5 DAYS BEFORE ISSUING A FINAL NEGATIVE REPORT     Performed at [facility]   Report Status PENDING   Incomplete     Studies: No results found.  Scheduled Meds: . antiseptic oral rinse  15 mL Mouth Rinse BID  . cilostazol  50 mg Oral BID  . diltiazem  60 mg Oral Q6H  . FLUoxetine  40 mg Oral Daily  . gabapentin  400 mg Oral QHS  . heparin subcutaneous  5,000 Units Subcutaneous Q8H  . levofloxacin (LEVAQUIN) IV  750 mg Intravenous Q24H  . pantoprazole  40 mg Oral Daily  . polyethylene glycol  17 g Oral Daily   Continuous Infusions: . sodium chloride 20 mL/hr (11/17/13 0700)     Marinda Elk  [facility] Pager [phone].  If 8PM-8AM, please contact night-coverage at [email or web address], password [facility] 11/19/2013, 11:33 AM  LOS: 4 days

## 2013-11-20 LAB — GLUCOSE, CAPILLARY: Glucose-Capillary: 92 mg/dL (ref 70–99)

## 2013-11-20 MED ORDER — RIVAROXABAN (XARELTO) VTE STARTER PACK (15 & 20 MG): ORAL_TABLET | ORAL | Status: DC

## 2013-11-20 MED ORDER — DILTIAZEM HCL ER COATED BEADS 360 MG PO CP24: 360.0000 mg | ORAL_CAPSULE | Freq: Every day | ORAL | Status: AC

## 2013-11-20 MED ORDER — LEVOFLOXACIN 750 MG PO TABS
750.0000 mg | ORAL_TABLET | ORAL | Status: DC
  Filled 2013-11-20: qty 1

## 2013-11-20 MED ORDER — RIVAROXABAN 20 MG PO TABS: 20.0000 mg | ORAL_TABLET | Freq: Every day | ORAL | Status: DC

## 2013-11-20 MED ORDER — SODIUM CHLORIDE 0.9 % IJ SOLN
3.0000 mL | Freq: Two times a day (BID) | INTRAMUSCULAR | Status: DC
  Administered 2013-11-20: 3 mL via INTRAVENOUS

## 2013-11-20 MED ORDER — LEVOFLOXACIN 750 MG PO TABS: 750.0000 mg | ORAL_TABLET | ORAL | Status: DC

## 2013-11-20 NOTE — Discharge Summary (Addendum)
Physician Discharge Summary  [name] AOZ:[id number] DOB: January 28, 1929 DOA: 11/15/2013  PCP: Bosie Clos, MD  Admit date: 11/15/2013 Discharge date: 11/20/2013  Time spent: 35 minutes  Recommendations for Outpatient Follow-up:  1. Follow up with Oncologist in 1 week. 2. Follow up with PCP in 2 weeks.  Discharge Diagnoses:  Principal Problem:   Atrial fibrillation with RVR Active Problems:   Cancer of lung   Obstructive pneumonia   Discharge Condition: stable  Diet recommendation: Regular  Filed Weights   11/18/13 0537 11/19/13 0545 11/20/13 0637  Weight: 67.9 kg (149 lb 11.1 oz) 68.5 kg (151 lb 0.2 oz) 69.1 kg (152 lb 5.4 oz)    History of present illness:  79 y.o. male who presents with c/o mild dyspnea and tachycardia. He is unaware of when symptoms began but today he called his cardiologists office who recommended he come into the ED. 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   Hospital Course:  Obstructive pneumonia/right effusion:  - started on Rocephin and azithromycin. CXR showed post obstructive PNA, confirmed with chest. - Change to levaquin on 12.15.2014 which will cont for 4 days. - Fortunately effusion not loculated.  - afebrile sat > 90% on RA.  - Ct chest showed 12.13.2014: Right lower lung lobe, measuring 9.5 x 6.3 x 8.0 cm, Underlying airspace opacity  Associated with effusion. additional nodules noted at the periphery of the right middle lobe and left lung base.  A fib  - check and EKG irregular rhythm on telemetry.  - Cont. diltiazem. Start xarelto.  - now 70's.   Cancer of lung:  - Consult oncology on 12.15.2014. CHemo held. - Recommended follow up with concologist.   Procedures:  CT chest  Consultations:  oncology  Discharge Exam: Filed Vitals:   11/20/13 0940  BP: 101/56  Pulse: 114  Temp:   Resp: 20    General: A&O x3 Cardiovascular: RRR Respiratory: good air movement CTA  B/L  Discharge Instructions      Discharge Orders   Future Appointments Provider Department Dept Phone   12/04/2013 8:00 AM Runell Gess, MD [facility] [phone]   Future Orders Complete By Expires   Diet - low sodium heart healthy  As directed    Increase activity slowly  As directed        Medication List         cilostazol 50 MG tablet  Commonly known as:  PLETAL  Take 50 mg by mouth 2 (two) times daily.     diltiazem 360 MG 24 hr capsule  Commonly known as:  CARDIZEM CD  Take 1 capsule (360 mg total) by mouth daily.     FLUoxetine 40 MG capsule  Commonly known as:  PROZAC  Take 40 mg by mouth daily.     gabapentin 400 MG capsule  Commonly known as:  NEURONTIN  Take 400 mg by mouth at bedtime.     HYDROcodone-acetaminophen 5-325 MG per tablet  Commonly known as:  NORCO/VICODIN  Take 1 tablet by mouth every 4 (four) hours as needed. For pain     levofloxacin 750 MG tablet  Commonly known as:  LEVAQUIN  Take 1 tablet (750 mg total) by mouth daily.     lovastatin 40 MG tablet  Commonly known as:  MEVACOR  Take 40 mg by mouth at bedtime.     lubiprostone 24 MCG capsule  Commonly known as:  AMITIZA  Take 24 mcg by  mouth 2 (two) times daily with a meal.     niacin 500 MG tablet  Take 500 mg by mouth daily with breakfast.     omeprazole 20 MG capsule  Commonly known as:  PRILOSEC  Take 20 mg by mouth daily.     Rivaroxaban 20 MG Tabs tablet  Commonly known as:  XARELTO  Take 1 tablet (20 mg total) by mouth daily with supper.       Allergies  Allergen Reactions  . Penicillins Anaphylaxis   Follow-up Information   Follow up with [facility], VESHANA S On 11/26/2013. (@8 :45 am in [place] spoke with Vernona Rieger )    Specialty:  Hematology and Oncology   Contact information:   [address] [place] [address] [phone]       Follow up with Bosie Clos, MD On 12/03/2013. (@ 3:00 pm spoke with Joni Reining )     Specialty:  Family Medicine   Contact information:   [address] [place] [address] [phone]        The results of significant diagnostics from this hospitalization (including imaging, microbiology, ancillary and laboratory) are listed below for reference.    Significant Diagnostic Studies: Ct Chest Wo Contrast  11/16/2013   CLINICAL DATA:  Mild dyspnea; tachycardia.  EXAM: CT CHEST WITHOUT CONTRAST  TECHNIQUE: Multidetector CT imaging of the chest was performed following the standard protocol without IV contrast.  COMPARISON:  Chest radiograph performed 11/15/2013  FINDINGS: There is a diffusely irregular mass at the right lower lobe, measuring approximately 9.5 x 6.3 x 8.0 cm, with minimal associated calcification. This is compatible with the patient's known primary lung malignancy. Associated nodularity is noted within the surrounding lung; underlying airspace opacity likely reflects post-obstructive pneumonia. There is an associated small right pleural effusion.  Minimal emphysematous change is noted at the right upper lobe. Calcified and noncalcified pleural plaques likely reflect prior asbestos exposure. There is nodularity along the right major fissure, measuring 1.8 cm, compatible with metastatic disease. An 8 mm nodule is noted at the left lung base (image 46 of 73). A small 5 mm nodule is noted at the periphery of the right middle lobe (image 44 of 73).  Diffuse coronary artery calcifications are seen. The mediastinum is otherwise unremarkable in appearance. There may be right hilar lymphadenopathy, though this is not well assessed without contrast. Scattered calcific atherosclerotic disease is noted along the thoracic aorta and proximal great vessels. No pericardial effusion is identified.  A 1.0 cm hypodensity is noted within the left thyroid lobe. The thyroid gland is otherwise unremarkable in appearance. No axillary lymphadenopathy is seen. A left-sided chest port is noted  with its distal tip about the cavoatrial junction. The patient is status post median sternotomy.  The visualized portions of the liver and spleen are unremarkable in appearance. A few small stones are seen layering dependently within the gallbladder. The visualized portions of the pancreas and adrenal glands are within normal limits, with mild nonspecific stranding about the adrenal glands. A 2.9 cm cyst is noted at the upper pole of the right kidney. Scattered calcific atherosclerotic disease is noted along the proximal abdominal aorta and its branches, including at the origins of both renal arteries.  IMPRESSION: 1. Diffusely irregular mass at the right lower lung lobe, measuring 9.5 x 6.3 x 8.0 cm, with minimal associated calcification. This is compatible with the patient's known primary lung malignancy. 2. Underlying airspace opacity likely reflects post-obstructive pneumonia. Associated small right pleural effusion.  3. Nodularity along the right major fissure, measuring 1.8 cm, compatible with metastatic disease; additional nodules noted at the periphery of the right middle lobe and left lung base, likely reflecting satellite lesions. There may be right hilar lymphadenopathy, though this is not well assessed without contrast. 4. Diffuse coronary artery calcifications seen. 5. Cholelithiasis noted; the visualized gallbladder otherwise grossly unremarkable. 6. Right renal cyst. 7. Scattered calcific atherosclerotic disease along the proximal abdominal aorta and its branches, including at the origins of both renal arteries.   Electronically Signed   By: Roanna Raider M.D.   On: 11/16/2013 01:12   Dg Chest Port 1 View  11/15/2013   CLINICAL DATA:  Chest pain and shortness of breath.  EXAM: PORTABLE CHEST - 1 VIEW  COMPARISON:  None.  FINDINGS: Patient has a left-sided power port, tip to level of the upper right atrium. Patient has had median sternotomy and CABG. Heart is normal in size.  There is dense opacity  involving the right lung base, partially obscuring hemidiaphragm. Left lung is clear.  IMPRESSION: Right lower lobe infiltrate.   Electronically Signed   By: Rosalie Gums M.D.   On: 11/15/2013 22:05    Microbiology: Recent Results (from the past 240 hour(s))  CULTURE, BLOOD (ROUTINE X 2)     Status: None   Collection Time    11/16/13  2:30 AM      Result Value Range Status   Specimen Description BLOOD RIGHT ARM   Final   Special Requests BOTTLES DRAWN AEROBIC ONLY 6CC   Final   Culture  Setup Time     Final   Value: 11/16/2013 08:52     Performed at [facility]   Culture     Final   Value:        BLOOD CULTURE RECEIVED NO GROWTH TO DATE CULTURE WILL BE HELD FOR 5 DAYS BEFORE ISSUING A FINAL NEGATIVE REPORT     Performed at [facility]   Report Status PENDING   Incomplete  CULTURE, BLOOD (ROUTINE X 2)     Status: None   Collection Time    11/16/13  2:40 AM      Result Value Range Status   Specimen Description BLOOD RIGHT HAND   Final   Special Requests BOTTLES DRAWN AEROBIC ONLY 1CC   Final   Culture  Setup Time     Final   Value: 11/16/2013 08:49     Performed at [facility]   Culture     Final   Value:        BLOOD CULTURE RECEIVED NO GROWTH TO DATE CULTURE WILL BE HELD FOR 5 DAYS BEFORE ISSUING A FINAL NEGATIVE REPORT     Performed at [facility]   Report Status PENDING   Incomplete     Labs: Basic Metabolic Panel:  Recent Labs Lab 11/15/13 2341  NA 136  K 4.1  CL 103  CO2 24  GLUCOSE 105*  BUN 13  CREATININE 0.60  CALCIUM 7.8*  MG 1.5   Liver Function Tests: No results found for this basename: AST, ALT, ALKPHOS, BILITOT, PROT, ALBUMIN,  in the last 168 hours No results found for this basename: LIPASE, AMYLASE,  in the last 168 hours No results found for this basename: AMMONIA,  in the last 168 hours CBC:  Recent Labs Lab 11/15/13 2341  WBC 6.0  HGB 8.4*  HCT 26.6*  MCV 83.1  PLT 159   Cardiac Enzymes: No results  found for this basename: CKTOTAL, CKMB, CKMBINDEX, TROPONINI,  in the last 168 hours BNP: BNP (last 3 results)  Recent Labs  11/15/13 2341  PROBNP 1322.0*   CBG:  Recent Labs Lab 11/20/13 0600  GLUCAP 92       Signed:  FELIZ ORTIZ, ABRAHAM  [facility] 11/20/2013, 3:13 PM

## 2013-11-20 NOTE — Evaluation (Signed)
Seen and agree with SPT note Dorn Hartshorne Tabor Yoneko Talerico, PT [phone]  

## 2013-11-20 NOTE — Progress Notes (Signed)
CSW was notified that Physical Therapy recommended SNF rehab.  Per MD- -patient is medically stable for d/c today and CSW met with patient, his wife and daughter Carney Bern this afternoon. They are refusing SNF placement and plan to return home today. Patient has a ramp, 2 electric scooters, and all necessary DME.  He is followed by Genevieve Norlander for RN and PT.  Daughter states that she lives nearby and is supportive.  Patient's wife requires continuous oxygen but states that they manage at home. CSW notified RNCM- Durward Mallard of above and she is aware. No CSW needs identified. CSW signing off.  Lorri Frederick. [place]  [phone]

## 2013-11-20 NOTE — Evaluation (Signed)
Physical Therapy Evaluation Patient Details Name: [name] MRN: [id number] DOB: 06-27-1929 Today's Date: 11/20/2013 Time: 4098-1191 PT Time Calculation (min): 34 min  PT Assessment / Plan / Recommendation History of Present Illness  79 y.o. male who presents with c/o mild dyspnea and tachycardia. He is unaware of when symptoms began but today he called his cardiologists office who recommended he come into the ED. 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 Found to have post obstructive pNA with A. Fib with RVR  Clinical Impression  Patient states that his mobility at home consists transfers from his bed to chair with assistance of 1, sometimes 2 family members. He was total assist +2 today for stand pivot to his chair and state he would like to go home to live with his wife at home upon discharge however pt with AMS. Recommend ST-SNF for therapy and assist to maximize function and decrease caregiver burden. Will follow acutely to address below deficits as well as above.    PT Assessment  Patient needs continued PT services    Follow Up Recommendations  SNF;Supervision/Assistance - 24 hour    Does the patient have the potential to tolerate intense rehabilitation      Barriers to Discharge        Equipment Recommendations  None recommended by PT    Recommendations for Other Services     Frequency Min 3X/week    Precautions / Restrictions Precautions Precautions: Fall Restrictions Weight Bearing Restrictions: No   Pertinent Vitals/Pain SpO2 91% on RA at rest 90-93% with activity on RA; HR 116 -123 with activity; Pt reported 6/10 pain/cramping in R foot intermittant.      Mobility  Bed Mobility Bed Mobility: Rolling Left;Left Sidelying to Sit;Sitting - Scoot to [place] of Bed Rolling Left: 3: Mod assist Left Sidelying to Sit: 3: Mod assist Sitting - Scoot to Edge of Bed: 3: Mod assist Details for Bed Mobility Assistance: needed cues  to initiate movement, for hand placement and mod assist to elevate trunk; posterior weight shift, not able to hold trunk upright without support for >2 mins with bilat UE support on EOB Transfers Transfers: Sit to Stand;Stand to Sit;Stand Pivot Transfers Sit to Stand: 1: +2 Total assist;From bed;With upper extremity assist (x2) Sit to Stand: Patient Percentage: 30% Stand to Sit: 1: +2 Total assist;To chair/3-in-1;With upper extremity assist Stand to Sit: Patient Percentage: 30% Stand Pivot Transfers: 1: +2 Total assist Stand Pivot Transfers: Patient Percentage: 30% Details for Transfer Assistance: cues for hand placement, assist to elevate and anterior translation of hips; assist with navigation of RW, and +2 assistance neccessary to pivot hips; Ambulation/Gait Ambulation/Gait Assistance: Not tested (comment)    Exercises     PT Diagnosis: Difficulty walking;Generalized weakness  PT Problem List: Decreased strength;Decreased activity tolerance;Decreased mobility PT Treatment Interventions: Functional mobility training;Therapeutic activities;Patient/family education     PT Goals(Current goals can be found in the care plan section) Acute Rehab PT Goals Patient Stated Goal: to go home and be with his wife PT Goal Formulation: With patient Time For Goal Achievement: 12/04/13 Potential to Achieve Goals: Good  Visit Information  Last PT Received On: 11/20/13 Assistance Needed: +2 History of Present Illness: 79 y.o. male who presents with c/o mild dyspnea and tachycardia. He is unaware of when symptoms began but today he called his cardiologists office who recommended he come into the ED. 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 Found  to have post obstructive pNA with A. Fib with RVR       Prior Functioning  Home Living Family/patient expects to be discharged to:: Private residence Living Arrangements: Spouse/significant other Available Help at  Discharge: Family;Available PRN/intermittently Type of Home: House Home Access: Ramped entrance Home Layout: One level Home Equipment: Art gallery manager;Other (comment);Walker - 4 wheels (lift chair) Additional Comments: pt reports stepdgtr would visit to help him to his lift chair but not daily. Pt with poor recall of PLOF and unclear how accurate Prior Function Level of Independence: Needs assistance Gait / Transfers Assistance Needed: assist per family, was walking limited distances ADL's / Homemaking Assistance Needed: wife assisting with bathing/dressing, Wife prepared meals Communication / Swallowing Assistance Needed: HOH Communication Communication: HOH    Cognition  Cognition Arousal/Alertness: Awake/alert Behavior During Therapy: WFL for tasks assessed/performed Overall Cognitive Status: No family/caregiver present to determine baseline cognitive functioning Memory: Decreased short-term memory    Extremity/Trunk Assessment Upper Extremity Assessment Upper Extremity Assessment: Generalized weakness Lower Extremity Assessment Lower Extremity Assessment: Generalized weakness Cervical / Trunk Assessment Cervical / Trunk Assessment: Kyphotic   Balance    End of Session PT - End of Session Equipment Utilized During Treatment: Gait belt Activity Tolerance: Patient limited by fatigue Patient left: in chair;with call bell/phone within reach Nurse Communication: Mobility status  GP     Willette Pa, SPT 11/20/2013, 10:30 AM

## 2013-11-20 NOTE — Progress Notes (Signed)
Pt O4x in chair, Pt states chronic pain, PRN given, no complaints of CP or SOB.  Last BM 12-8. MD order tap water Em. Will continue to monitor Pt.

## 2013-11-20 NOTE — Progress Notes (Signed)
Pt d/c home with family. D/c instructions and medications reviewed with Pt. Pt and family state understanding . All Pt and family questions answered

## 2013-11-21 ENCOUNTER — Observation Stay: Payer: Self-pay | Admitting: Specialist

## 2013-11-21 LAB — CBC WITH DIFFERENTIAL/PLATELET
Basophil #: 0 x10 3/mm 3
Basophil %: 0.3 %
Eosinophil #: 0 x10 3/mm 3
Eosinophil %: 0.5 %
HCT: 25.9 % — ABNORMAL LOW
HGB: 8.4 g/dL — ABNORMAL LOW
Lymphocyte %: 7.5 %
Lymphs Abs: 0.6 x10 3/mm 3 — ABNORMAL LOW
MCH: 25.8 pg — ABNORMAL LOW
MCHC: 32.3 g/dL
MCV: 80 fL
Monocyte #: 1 "x10 3/mm "
Monocyte %: 13.1 %
Neutrophil #: 6 x10 3/mm 3
Neutrophil %: 78.6 %
Platelet: 355 x10 3/mm 3
RBC: 3.24 x10 6/mm 3 — ABNORMAL LOW
RDW: 22.2 % — ABNORMAL HIGH
WBC: 7.6 x10 3/mm 3

## 2013-11-21 LAB — URINALYSIS, COMPLETE
Bilirubin,UR: NEGATIVE
Glucose,UR: NEGATIVE mg/dL
Ketone: NEGATIVE
Nitrite: NEGATIVE
Ph: 5
Protein: 100
RBC,UR: 63 /HPF
Specific Gravity: 1.021
Squamous Epithelial: NONE SEEN
WBC UR: 68 /HPF

## 2013-11-21 LAB — BASIC METABOLIC PANEL WITH GFR
Anion Gap: 5 — ABNORMAL LOW
BUN: 9 mg/dL
Calcium, Total: 8.6 mg/dL
Chloride: 104 mmol/L
Co2: 26 mmol/L
Creatinine: 0.69 mg/dL
EGFR (African American): >60
EGFR (Non-African Amer.): >60
Glucose: 115 mg/dL — ABNORMAL HIGH
Osmolality: 270
Potassium: 3.6 mmol/L
Sodium: 135 mmol/L — ABNORMAL LOW

## 2013-11-21 LAB — CK TOTAL AND CKMB (NOT AT ARMC)
CK, Total: 67 U/L (ref 35–232)
CK-MB: <0.5 ng/mL — ABNORMAL LOW (ref 0.5–3.6)

## 2013-11-21 LAB — MAGNESIUM: Magnesium: 1.6 mg/dL — ABNORMAL LOW

## 2013-11-22 LAB — BASIC METABOLIC PANEL
BUN: 7 mg/dL (ref 7–18)
Chloride: 104 mmol/L (ref 98–107)
Creatinine: 0.65 mg/dL (ref 0.60–1.30)
EGFR (African American): >60

## 2013-11-22 LAB — CBC WITH DIFFERENTIAL/PLATELET
Basophil: 1 %
Comment - H1-Com3: NORMAL
Eosinophil: 3 %
HGB: 7.6 g/dL — ABNORMAL LOW (ref 13.0–18.0)
Lymphocytes: 14 %
MCH: 26.5 pg (ref 26.0–34.0)
MCHC: 33.2 g/dL (ref 32.0–36.0)
MCV: 80 fL (ref 80–100)
Monocytes: 15 %
RBC: 2.87 10*6/uL — ABNORMAL LOW (ref 4.40–5.90)
RDW: 22.3 % — ABNORMAL HIGH (ref 11.5–14.5)
Segmented Neutrophils: 67 %

## 2013-11-22 LAB — OCCULT BLOOD X 1 CARD TO LAB, STOOL: Occult Blood, Feces: NEGATIVE

## 2013-11-22 LAB — CULTURE, BLOOD (ROUTINE X 2)

## 2013-11-23 LAB — CBC WITH DIFFERENTIAL/PLATELET
Basophil #: 0 10*3/uL (ref 0.0–0.1)
Basophil %: 0.6 %
Eosinophil %: 1.8 %
HCT: 24.5 % — ABNORMAL LOW (ref 40.0–52.0)
HGB: 8 g/dL — ABNORMAL LOW (ref 13.0–18.0)
Lymphocyte %: 12.7 %
MCH: 26.1 pg (ref 26.0–34.0)
MCHC: 32.6 g/dL (ref 32.0–36.0)
MCV: 80 fL (ref 80–100)
Monocyte %: 15.7 %
Neutrophil #: 4.6 10*3/uL (ref 1.4–6.5)
Platelet: 357 10*3/uL (ref 150–440)
RBC: 3.07 10*6/uL — ABNORMAL LOW (ref 4.40–5.90)

## 2013-11-23 LAB — BASIC METABOLIC PANEL
BUN: 6 mg/dL — ABNORMAL LOW (ref 7–18)
Chloride: 105 mmol/L (ref 98–107)
Co2: 26 mmol/L (ref 21–32)
Creatinine: 0.59 mg/dL — ABNORMAL LOW (ref 0.60–1.30)
EGFR (African American): >60
Glucose: 82 mg/dL (ref 65–99)
Osmolality: 271 (ref 275–301)
Potassium: 3.8 mmol/L (ref 3.5–5.1)
Sodium: 137 mmol/L (ref 136–145)

## 2013-11-24 LAB — URINE CULTURE

## 2013-11-28 ENCOUNTER — Non-Acute Institutional Stay (SKILLED_NURSING_FACILITY): Payer: Medicare Other | Admitting: Adult Health

## 2013-11-28 DIAGNOSIS — M549 Dorsalgia, unspecified: Secondary | ICD-10-CM

## 2013-11-28 DIAGNOSIS — N390 Urinary tract infection, site not specified: Secondary | ICD-10-CM

## 2013-11-28 DIAGNOSIS — I10 Essential (primary) hypertension: Secondary | ICD-10-CM

## 2013-11-28 DIAGNOSIS — K219 Gastro-esophageal reflux disease without esophagitis: Secondary | ICD-10-CM

## 2013-11-28 DIAGNOSIS — G8929 Other chronic pain: Secondary | ICD-10-CM

## 2013-11-28 DIAGNOSIS — I4891 Unspecified atrial fibrillation: Secondary | ICD-10-CM

## 2013-11-28 DIAGNOSIS — R55 Syncope and collapse: Secondary | ICD-10-CM

## 2013-11-28 DIAGNOSIS — G609 Hereditary and idiopathic neuropathy, unspecified: Secondary | ICD-10-CM

## 2013-11-28 DIAGNOSIS — I739 Peripheral vascular disease, unspecified: Secondary | ICD-10-CM

## 2013-11-28 DIAGNOSIS — F329 Major depressive disorder, single episode, unspecified: Secondary | ICD-10-CM

## 2013-11-28 DIAGNOSIS — N138 Other obstructive and reflux uropathy: Secondary | ICD-10-CM

## 2013-11-28 DIAGNOSIS — G629 Polyneuropathy, unspecified: Secondary | ICD-10-CM

## 2013-11-28 DIAGNOSIS — N139 Obstructive and reflux uropathy, unspecified: Secondary | ICD-10-CM

## 2013-11-28 DIAGNOSIS — N401 Enlarged prostate with lower urinary tract symptoms: Secondary | ICD-10-CM

## 2013-11-28 DIAGNOSIS — C3490 Malignant neoplasm of unspecified part of unspecified bronchus or lung: Secondary | ICD-10-CM

## 2013-11-28 DIAGNOSIS — K5900 Constipation, unspecified: Secondary | ICD-10-CM

## 2013-12-01 ENCOUNTER — Encounter: Payer: Self-pay | Admitting: Adult Health

## 2013-12-01 DIAGNOSIS — G8929 Other chronic pain: Secondary | ICD-10-CM | POA: Insufficient documentation

## 2013-12-01 DIAGNOSIS — N390 Urinary tract infection, site not specified: Secondary | ICD-10-CM | POA: Insufficient documentation

## 2013-12-01 DIAGNOSIS — K5900 Constipation, unspecified: Secondary | ICD-10-CM | POA: Insufficient documentation

## 2013-12-01 DIAGNOSIS — F329 Major depressive disorder, single episode, unspecified: Secondary | ICD-10-CM | POA: Insufficient documentation

## 2013-12-01 DIAGNOSIS — N138 Other obstructive and reflux uropathy: Secondary | ICD-10-CM | POA: Insufficient documentation

## 2013-12-01 DIAGNOSIS — K219 Gastro-esophageal reflux disease without esophagitis: Secondary | ICD-10-CM | POA: Insufficient documentation

## 2013-12-01 DIAGNOSIS — R55 Syncope and collapse: Secondary | ICD-10-CM | POA: Insufficient documentation

## 2013-12-01 DIAGNOSIS — G629 Polyneuropathy, unspecified: Secondary | ICD-10-CM | POA: Insufficient documentation

## 2013-12-01 NOTE — Progress Notes (Signed)
Patient ID: [name], male   DOB: 01/16/1929, 79 y.o.   MRN: [id number]      [name]  Allergies  Allergen Reactions  . Penicillins Anaphylaxis     Chief Complaint  Patient presents with  . Hospitalization Follow-up    HPI:  He has had 2 recent hospitalizations one for pneumonia. The most recent for syncope; weakness and uti. He has a history of lung cancer and vocal cord cancer for which he has completed chemo and radiation therapy in the past couple of week. He is here for short term rehab and with the goal to return back home.     Past Medical History  Diagnosis Date  . Hypertension   . PVD (peripheral vascular disease)     06/27/06 widley patent subclavian vertebrl arteries. right CIA stent  . CAD (coronary artery disease)   . S/P CABG x 3 1997  . Iliac aneurysm     doppler 07/27/12: 3.1x3.0cm  . Cancer     Lung & liver- Dr. Wendie Simmer  . Malignant neoplasm of pharynx, unspecified   . Other and unspecified hyperlipidemia   . Carotid artery disease     sstatus post remote left carotid endarterectomy  . Hyperlipidemia   . Atrial fibrillation with RVR 11/16/2013  . Essential hypertension, benign 01/29/2013  . Iliac artery aneurysm 01/29/2013  . Cancer of lung 01/29/2013  . Obstructive pneumonia 11/16/2013  . Syncope   . Peripheral neuropathy   . Chronic back pain      Past Surgical History  Procedure Laterality Date  . Coronary artery bypass graft  1997  . Carotid endarterectomy      Left  . Cervical discectomy    . Lumbar disc surgery      VITAL SIGNS BP 116/68  Pulse 73  Ht 5\' 11"  (1.803 m)  Wt 145 lb (65.772 kg)  BMI 20.23 kg/m2   Patient's Medications  New Prescriptions   No medications on file  Previous Medications   CILOSTAZOL (PLETAL) 50 MG TABLET    Take 50 mg by mouth 2 (two) times daily.   DILTIAZEM (CARDIZEM CD) 360 MG 24 HR CAPSULE    Take 1 capsule (360 mg total) by mouth daily.   DOCUSATE SODIUM (COLACE) 100 MG CAPSULE    Take  100 mg by mouth 2 (two) times daily as needed for mild constipation.   FINASTERIDE (PROSCAR) 5 MG TABLET    Take 5 mg by mouth daily.   FLUOXETINE (PROZAC) 40 MG CAPSULE    Take 40 mg by mouth daily.   GABAPENTIN (NEURONTIN) 300 MG CAPSULE    Take 300 mg by mouth at bedtime.    LEVOFLOXACIN (LEVAQUIN) 750 MG TABLET    Take 1 tablet (750 mg total) by mouth daily.   OMEPRAZOLE (PRILOSEC) 20 MG CAPSULE    Take 20 mg by mouth daily.   ONDANSETRON (ZOFRAN) 4 MG TABLET    Take 4 mg by mouth every 8 (eight) hours as needed for nausea or vomiting.   OXYCODONE (OXY IR/ROXICODONE) 5 MG IMMEDIATE RELEASE TABLET    Take 5 mg by mouth every 4 (four) hours as needed for severe pain.   POLYETHYLENE GLYCOL (MIRALAX / GLYCOLAX) PACKET    Take 17 g by mouth daily as needed.   TAMSULOSIN (FLOMAX) 0.4 MG CAPS CAPSULE    Take 0.4 mg by mouth daily.  Modified Medications   No medications on file  Discontinued Medications   HYDROCODONE-ACETAMINOPHEN (NORCO/VICODIN) 5-325 MG  PER TABLET    Take 1 tablet by mouth every 4 (four) hours as needed. For pain   LOVASTATIN (MEVACOR) 40 MG TABLET    Take 40 mg by mouth at bedtime.   LUBIPROSTONE (AMITIZA) 24 MCG CAPSULE    Take 24 mcg by mouth 2 (two) times daily with a meal.   NIACIN 500 MG TABLET    Take 500 mg by mouth daily with breakfast.   RIVAROXABAN (XARELTO) 20 MG TABS TABLET    Take 1 tablet (20 mg total) by mouth daily with supper.    SIGNIFICANT DIAGNOSTIC EXAMS  11-21-13: chest x-ray: 1. Moderate underlying right pleural effusion and associated lower lobe opacity likely reflecting a combination of the known malignancy with superimposed atelectasis and or infiltrate. 2. Stable mild cardiomegaly and mild vascular congestion.   11-21-13: ct of head: moderate diffuse atrophy no mass hemorrhage extra-axial fluid collection or mid line shift. There is evidence of prior infarct int he anterior left parietal lobe stable. There is small vessel disease throughout.    11-21-13: ct ofcservical spine: extensive spondylosis and arthritic changes.      LABS REVIEWED:   11-23-13: wbc 6.7 ;hgb 8.0; hct 24.5; mcv 80; plt 357;glucose 82; bun 6; creat 0.59; k+3.8; na++137 Urine culture: staphylococcus coagulase neg.    Review of Systems  Constitutional: Negative for malaise/fatigue.  Eyes: Negative for blurred vision.  Respiratory: Negative for cough and shortness of breath.   Cardiovascular: Negative for chest pain, palpitations and leg swelling.  Gastrointestinal: Negative for heartburn, diarrhea and constipation.  Musculoskeletal: Positive for back pain and myalgias.       He is complaining of bilateral foot pain at this time which is not being managed   Skin: Negative.   Neurological: Positive for weakness. Negative for headaches.  Psychiatric/Behavioral: Negative for depression. The patient is not nervous/anxious.     Physical Exam  Constitutional: He is oriented to person, place, and time. No distress.  frail  Neck: Neck supple. No JVD present.  Cardiovascular: Normal rate, regular rhythm and intact distal pulses.   Respiratory: Effort normal. No respiratory distress. He has no wheezes.  Breath sounds diminished throughout.   GI: Soft. Bowel sounds are normal. He exhibits no distension. There is no tenderness.  Musculoskeletal: Normal range of motion. He exhibits no edema.  Neurological: He is alert and oriented to person, place, and time.  Skin: Skin is warm and dry. He is not diaphoretic.  Psychiatric: He has a normal mood and affect.     ASSESSMENT/ PLAN:  1. Peripheral neuropathy: his pain is worse; will increase his neurontin to 300 mg twice daily and will monitor his status.   2. Bph: has foley in place; will continue flomax 0.4 mg daily and proscar 5 mg daily and will monitor his status.   3. Uti: will complete his levaquin and will monitor his status  4. Syncope: no further issues present; will continue with therapy as  directed to increase his strength; and level of independence; his goal is to return home. Will continue to monitor  5. Pvd:  is without change in status. Will continue pletal 50 mg twice daily and will monitor  6. Afib: will continue his diltiazem er 360 mg daily for rate control and will monitor his status  7. Hypertension: is stable will continue diltiazem er 360 mg daily   8. Gerd: will continue prilosec 20 mg daily  9. Depression: will continue prozac 40 mg daily  10. Chronic back  pain: this is presently being managed with oxycodone 5 mg every 4 hours as needed   11. Lung cancer: no change in his status; will not make changes and will monitor   12: constipation: will continue miralax daily as needed and colace twice daily as needed    Will check cbc and cmp next draw  Time spent with patient 50 minutes

## 2013-12-04 ENCOUNTER — Ambulatory Visit: Payer: Medicare Other | Admitting: Cardiovascular Disease

## 2013-12-06 ENCOUNTER — Ambulatory Visit: Payer: Self-pay | Admitting: Hematology and Oncology

## 2013-12-10 ENCOUNTER — Encounter: Payer: Self-pay | Admitting: Internal Medicine

## 2013-12-10 ENCOUNTER — Non-Acute Institutional Stay (SKILLED_NURSING_FACILITY): Payer: Medicare Other | Admitting: Internal Medicine

## 2013-12-10 DIAGNOSIS — G609 Hereditary and idiopathic neuropathy, unspecified: Secondary | ICD-10-CM

## 2013-12-10 DIAGNOSIS — R5383 Other fatigue: Secondary | ICD-10-CM

## 2013-12-10 DIAGNOSIS — I739 Peripheral vascular disease, unspecified: Secondary | ICD-10-CM

## 2013-12-10 DIAGNOSIS — F3289 Other specified depressive episodes: Secondary | ICD-10-CM

## 2013-12-10 DIAGNOSIS — R531 Weakness: Secondary | ICD-10-CM | POA: Insufficient documentation

## 2013-12-10 DIAGNOSIS — R5381 Other malaise: Secondary | ICD-10-CM

## 2013-12-10 DIAGNOSIS — N401 Enlarged prostate with lower urinary tract symptoms: Secondary | ICD-10-CM

## 2013-12-10 DIAGNOSIS — F329 Major depressive disorder, single episode, unspecified: Secondary | ICD-10-CM

## 2013-12-10 DIAGNOSIS — F32A Depression, unspecified: Secondary | ICD-10-CM

## 2013-12-10 DIAGNOSIS — N138 Other obstructive and reflux uropathy: Secondary | ICD-10-CM

## 2013-12-10 DIAGNOSIS — M549 Dorsalgia, unspecified: Secondary | ICD-10-CM

## 2013-12-10 DIAGNOSIS — G8929 Other chronic pain: Secondary | ICD-10-CM

## 2013-12-10 DIAGNOSIS — K219 Gastro-esophageal reflux disease without esophagitis: Secondary | ICD-10-CM

## 2013-12-10 DIAGNOSIS — N139 Obstructive and reflux uropathy, unspecified: Secondary | ICD-10-CM

## 2013-12-10 DIAGNOSIS — G629 Polyneuropathy, unspecified: Secondary | ICD-10-CM

## 2013-12-10 DIAGNOSIS — J189 Pneumonia, unspecified organism: Secondary | ICD-10-CM

## 2013-12-10 NOTE — Progress Notes (Signed)
Patient ID: [name], male   DOB: 11-15-1929, 80 y.o.   MRN: [id number]    [facility]    PCP: Eulas Post, MD  Code Status: dnr  Allergies  Allergen Reactions  . Penicillins Anaphylaxis    Chief Complaint: new admit  HPI:  80 y/o male patient is here for STR after hospital admission from 11/15/13- 11/27/13 with obstructive pneumonia and has completed course of levaquin. He also had afib with RVR that was controlled with diltiazem and xarelto was started.  Of note- patient has history of lung cancer with mets to the liver and is on chemotherapy for this. He follows with oncology He was seen in his room today. He complaints of pain in his legs with needle like ongoing sensation. His neurontin dose was recently arranged but has not elped him. He is working with therapy team. No falls reported  Review of Systems:  Constitutional: Negative for fever, chills, weight loss, malaise/fatigue and diaphoresis.  HENT: Negative for congestion, hearing loss and sore throat.   Eyes: Negative for blurred vision, double vision and discharge.  Respiratory: Negative for cough, sputum production, shortness of breath and wheezing.   Cardiovascular: Negative for chest pain, palpitations, orthopnea and leg swelling.  Gastrointestinal: Negative for heartburn, nausea, vomiting, abdominal pain, diarrhea and constipation.  Genitourinary: has indwelling foley Musculoskeletal: Negative for falls, joint pain and myalgias. has back and foot pain Skin: Negative for itching and rash.  Neurological: Positive for weakness. Negative for dizziness, tingling, focal weakness and headaches.  Psychiatric/Behavioral: Negative for depression and memory loss. The patient is not nervous/anxious.     Past Medical History  Diagnosis Date  . Hypertension   . PVD (peripheral vascular disease)     06/27/06 widley patent subclavian vertebrl arteries. right CIA stent  . CAD (coronary artery disease)     . S/P CABG x 3 1997  . Iliac aneurysm     doppler 07/27/12: 3.1x3.0cm  . Cancer     Lung & liver- Dr. Kallie Edward  . Malignant neoplasm of pharynx, unspecified   . Other and unspecified hyperlipidemia   . Carotid artery disease     sstatus post remote left carotid endarterectomy  . Hyperlipidemia   . Atrial fibrillation with RVR 11/16/2013  . Essential hypertension, benign 01/29/2013  . Iliac artery aneurysm 01/29/2013  . Cancer of lung 01/29/2013  . Obstructive pneumonia 11/16/2013  . Syncope   . Peripheral neuropathy   . Chronic back pain    Past Surgical History  Procedure Laterality Date  . Coronary artery bypass graft  1997  . Carotid endarterectomy      Left  . Cervical discectomy    . Lumbar disc surgery     Social History:   reports that he has never smoked. He does not have any smokeless tobacco history on file. He reports that he does not drink alcohol. His drug history is not on file.  Family History  Problem Relation Age of Onset  . Cancer Brother   . Heart failure Father   . Diabetes Sister     Medications: Patient's Medications  New Prescriptions   No medications on file  Previous Medications   CILOSTAZOL (PLETAL) 50 MG TABLET    Take 50 mg by mouth 2 (two) times daily.   DILTIAZEM (CARDIZEM CD) 360 MG 24 HR CAPSULE    Take 1 capsule (360 mg total) by mouth daily.   DOCUSATE SODIUM (COLACE) 100 MG CAPSULE    Take  100 mg by mouth 2 (two) times daily as needed for mild constipation.   FINASTERIDE (PROSCAR) 5 MG TABLET    Take 5 mg by mouth daily.   FLUOXETINE (PROZAC) 40 MG CAPSULE    Take 40 mg by mouth daily.   GABAPENTIN (NEURONTIN) 400 MG CAPSULE    Take 400 mg by mouth at bedtime.    LEVOFLOXACIN (LEVAQUIN) 750 MG TABLET    Take 1 tablet (750 mg total) by mouth daily.   OMEPRAZOLE (PRILOSEC) 20 MG CAPSULE    Take 20 mg by mouth daily.   ONDANSETRON (ZOFRAN) 4 MG TABLET    Take 4 mg by mouth every 8 (eight) hours as needed for nausea or vomiting.    OXYCODONE (OXY IR/ROXICODONE) 5 MG IMMEDIATE RELEASE TABLET    Take 5 mg by mouth every 4 (four) hours as needed for severe pain.   POLYETHYLENE GLYCOL (MIRALAX / GLYCOLAX) PACKET    Take 17 g by mouth daily as needed.   TAMSULOSIN (FLOMAX) 0.4 MG CAPS CAPSULE    Take 0.4 mg by mouth daily.  Modified Medications   No medications on file  Discontinued Medications   No medications on file     Physical Exam: Filed Vitals:   12/10/13 1554  BP: 124/72  Pulse: 84  Temp: 98.1 F (36.7 C)  Resp: 20  SpO2: 96%   General- elderly male in no acute distress, hoarse voice Head- atraumatic, normocephalic Eyes- PERRLA, EOMI, no pallor, no icterus Neck- no lymphadenopathy, no thyromegaly Chest- no chest wall deformities, no chest wall tenderness Cardiovascular- irregular heart rate, no murmurs/ rubs/ gallops Respiratory- bilateral clear to auscultation with decreased air entry, no wheeze or crackles Abdomen- bowel sounds present, soft, non tender Musculoskeletal- able to move his upper extremities, using wheelchair, has b/l lower extremity weakness Neurological- no focal deficit Psychiatry- alert and oriented to person, place, normal mood and affect Skin- callus in left foot, ecchymoses and bruise in both feet at pressure points, heel floaters present   Labs reviewed: Basic Metabolic Panel:  Recent Labs  11/15/13 2341  NA 136  K 4.1  CL 103  CO2 24  GLUCOSE 105*  BUN 13  CREATININE 0.60  CALCIUM 7.8*  MG 1.5   CBC:  Recent Labs  11/15/13 2341  WBC 6.0  HGB 8.4*  HCT 26.6*  MCV 83.1  PLT 159   11-23-13: wbc 6.7 ;hgb 8.0; hct 24.5; mcv 80; plt 357;glucose 82; bun 6; creat 0.59; k+3.8; na++137 Urine culture: staphylococcus coagulase neg.    Radiological Exams: Ct Chest Wo Contrast  11/16/2013   CLINICAL DATA:  Mild dyspnea; tachycardia.  EXAM: CT CHEST WITHOUT CONTRAST  TECHNIQUE: Multidetector CT imaging of the chest was performed following the standard protocol  without IV contrast.  COMPARISON:  Chest radiograph performed 11/15/2013  FINDINGS: There is a diffusely irregular mass at the right lower lobe, measuring approximately 9.5 x 6.3 x 8.0 cm, with minimal associated calcification. This is compatible with the patient's known primary lung malignancy. Associated nodularity is noted within the surrounding lung; underlying airspace opacity likely reflects post-obstructive pneumonia. There is an associated small right pleural effusion.  Minimal emphysematous change is noted at the right upper lobe. Calcified and noncalcified pleural plaques likely reflect prior asbestos exposure. There is nodularity along the right major fissure, measuring 1.8 cm, compatible with metastatic disease. An 8 mm nodule is noted at the left lung base (image 46 of 73). A small 5 mm nodule is noted at  the periphery of the right middle lobe (image 44 of 73).  Diffuse coronary artery calcifications are seen. The mediastinum is otherwise unremarkable in appearance. There may be right hilar lymphadenopathy, though this is not well assessed without contrast. Scattered calcific atherosclerotic disease is noted along the thoracic aorta and proximal great vessels. No pericardial effusion is identified.  A 1.0 cm hypodensity is noted within the left thyroid lobe. The thyroid gland is otherwise unremarkable in appearance. No axillary lymphadenopathy is seen. A left-sided chest port is noted with its distal tip about the cavoatrial junction. The patient is status post median sternotomy.  The visualized portions of the liver and spleen are unremarkable in appearance. A few small stones are seen layering dependently within the gallbladder. The visualized portions of the pancreas and adrenal glands are within normal limits, with mild nonspecific stranding about the adrenal glands. A 2.9 cm cyst is noted at the upper pole of the right kidney. Scattered calcific atherosclerotic disease is noted along the proximal  abdominal aorta and its branches, including at the origins of both renal arteries.  IMPRESSION: 1. Diffusely irregular mass at the right lower lung lobe, measuring 9.5 x 6.3 x 8.0 cm, with minimal associated calcification. This is compatible with the patient's known primary lung malignancy. 2. Underlying airspace opacity likely reflects post-obstructive pneumonia. Associated small right pleural effusion. 3. Nodularity along the right major fissure, measuring 1.8 cm, compatible with metastatic disease; additional nodules noted at the periphery of the right middle lobe and left lung base, likely reflecting satellite lesions. There may be right hilar lymphadenopathy, though this is not well assessed without contrast. 4. Diffuse coronary artery calcifications seen. 5. Cholelithiasis noted; the visualized gallbladder otherwise grossly unremarkable. 6. Right renal cyst. 7. Scattered calcific atherosclerotic disease along the proximal abdominal aorta and its branches, including at the origins of both renal arteries.   Electronically Signed   By: Garald Balding M.D.   On: 11/16/2013 01:12   Dg Chest Port 1 View  11/15/2013   CLINICAL DATA:  Chest pain and shortness of breath.  EXAM: PORTABLE CHEST - 1 VIEW  COMPARISON:  None.  FINDINGS: Patient has a left-sided power port, tip to level of the upper right atrium. Patient has had median sternotomy and CABG. Heart is normal in size.  There is dense opacity involving the right lung base, partially obscuring hemidiaphragm. Left lung is clear.  IMPRESSION: Right lower lobe infiltrate.   Electronically Signed   By: Shon Hale M.D.   On: 11/15/2013 22:05   11-21-13: chest x-ray: 1. Moderate underlying right pleural effusion and associated lower lobe opacity likely reflecting a combination of the known malignancy with superimposed atelectasis and or infiltrate. 2. Stable mild cardiomegaly and mild vascular congestion.   11-21-13: ct of head: moderate diffuse atrophy no mass  hemorrhage extra-axial fluid collection or mid line shift. There is evidence of prior infarct int he anterior left parietal lobe stable. There is small vessel disease throughout.   11-21-13: ct ofcservical spine: extensive spondylosis and arthritic changes.    Assessment/Plan  Generalized weakness- to work with therapy team, PT and OT. Fall precautions. Encourage po intake. Pressure ulcer prophylaxis  Peripheral neuropathy- worsened. Will increase his neurontin to 300 mg tid for now and reassess. Continue norco current regimen  PVD- continue pletal, heel floaters  afib- rate currently controlled. Continue cardizem 360 mg daily for heart rate control. Continue xarelto for stroke prophylaxis  Chronic back pain- this is presently being managed with oxycodone  5 mg every 4 hours as needed   Depression- will continue prozac 40 mg daily  BPH- has foley in place, continue foley care. will continue flomax 0.4 mg daily and proscar 5 mg daily. Monitor clinically  Pneumonia- completed his levaquin course, breathing stable  Hypertension- stable, continue diltiazem er 360 mg daily   Jerrye Bushy- will continue prilosec 20 mg daily   Goals of care: to return home after completion of STR   Labs/tests ordered: cbc, cmp

## 2013-12-11 ENCOUNTER — Encounter: Payer: Self-pay | Admitting: Adult Health

## 2013-12-11 ENCOUNTER — Non-Acute Institutional Stay (SKILLED_NURSING_FACILITY): Payer: Medicare Other | Admitting: Adult Health

## 2013-12-11 DIAGNOSIS — L89899 Pressure ulcer of other site, unspecified stage: Secondary | ICD-10-CM

## 2013-12-11 NOTE — Progress Notes (Signed)
Patient ID: [name], male   DOB: 28-May-1929, 80 y.o.   MRN: [id number]     [facility]  Allergies  Allergen Reactions  . Penicillins Anaphylaxis     Chief Complaint  Patient presents with  . Acute Visit    penile wound     HPI: Nursing staff has asked me to see him for his urinary meatus which was bleeding. Upon arrival he had bleeding present. With skin breakdown present. He is not complaining of pain present.   Past Medical History  Diagnosis Date  . Hypertension   . PVD (peripheral vascular disease)     06/27/06 widley patent subclavian vertebrl arteries. right CIA stent  . CAD (coronary artery disease)   . S/P CABG x 3 1997  . Iliac aneurysm     doppler 07/27/12: 3.1x3.0cm  . Cancer     Lung & liver- Dr. Kallie Edward  . Malignant neoplasm of pharynx, unspecified   . Other and unspecified hyperlipidemia   . Carotid artery disease     sstatus post remote left carotid endarterectomy  . Hyperlipidemia   . Atrial fibrillation with RVR 11/16/2013  . Essential hypertension, benign 01/29/2013  . Iliac artery aneurysm 01/29/2013  . Cancer of lung 01/29/2013  . Obstructive pneumonia 11/16/2013  . Syncope   . Peripheral neuropathy   . Chronic back pain     Past Surgical History  Procedure Laterality Date  . Coronary artery bypass graft  1997  . Carotid endarterectomy      Left  . Cervical discectomy    . Lumbar disc surgery      VITAL SIGNS BP 110/63  Pulse 92  Ht 5\' 11"  (1.803 m)  Wt 155 lb 6.4 oz (70.489 kg)  BMI 21.68 kg/m2   Patient's Medications  New Prescriptions   No medications on file  Previous Medications   CILOSTAZOL (PLETAL) 50 MG TABLET    Take 50 mg by mouth 2 (two) times daily.   DICLOFENAC SODIUM (VOLTAREN) 1 % GEL    Apply 2 g topically 4 (four) times daily as needed.   DILTIAZEM (CARDIZEM CD) 360 MG 24 HR CAPSULE    Take 1 capsule (360 mg total) by mouth daily.   DOCUSATE SODIUM (COLACE) 100 MG CAPSULE    Take 100 mg by mouth 2 (two)  times daily as needed for mild constipation.   FINASTERIDE (PROSCAR) 5 MG TABLET    Take 5 mg by mouth daily.   FLUOXETINE (PROZAC) 40 MG CAPSULE    Take 40 mg by mouth daily.   GABAPENTIN (NEURONTIN) 400 MG CAPSULE    Take 300 mg by mouth 3 (three) times daily.    OMEPRAZOLE (PRILOSEC) 20 MG CAPSULE    Take 20 mg by mouth daily.   ONDANSETRON (ZOFRAN) 4 MG TABLET    Take 4 mg by mouth every 8 (eight) hours as needed for nausea or vomiting.   OXYCODONE (OXY IR/ROXICODONE) 5 MG IMMEDIATE RELEASE TABLET    Take 5 mg by mouth every 4 (four) hours as needed for severe pain.   POLYETHYLENE GLYCOL (MIRALAX / GLYCOLAX) PACKET    Take 17 g by mouth daily as needed.   TAMSULOSIN (FLOMAX) 0.4 MG CAPS CAPSULE    Take 0.4 mg by mouth daily.  Modified Medications   No medications on file  Discontinued Medications   LEVOFLOXACIN (LEVAQUIN) 750 MG TABLET    Take 1 tablet (750 mg total) by mouth daily.    SIGNIFICANT DIAGNOSTIC EXAMS  11-21-13: chest x-ray: 1. Moderate underlying right pleural effusion and associated lower lobe opacity likely reflecting a combination of the known malignancy with superimposed atelectasis and or infiltrate. 2. Stable mild cardiomegaly and mild vascular congestion.   11-21-13: ct of head: moderate diffuse atrophy no mass hemorrhage extra-axial fluid collection or mid line shift. There is evidence of prior infarct int he anterior left parietal lobe stable. There is small vessel disease throughout.   11-21-13: ct ofcservical spine: extensive spondylosis and arthritic changes.      LABS REVIEWED:   11-23-13: wbc 6.7 ;hgb 8.0; hct 24.5; mcv 80; plt 357;glucose 82; bun 6; creat 0.59; k+3.8; na++137 Urine culture: staphylococcus coagulase neg.      Review of Systems  Respiratory: Negative for cough and shortness of breath.   Cardiovascular: Negative for chest pain and palpitations.  Gastrointestinal: Negative for heartburn and abdominal pain.  Musculoskeletal: Negative  for joint pain and myalgias.  Skin: Negative.        Has bleeding from meatus around foley  Neurological: Negative for weakness.  Psychiatric/Behavioral: The patient is not nervous/anxious.      Physical Exam  Constitutional: He is oriented to person, place, and time. No distress.  frail  Neck: Neck supple. No JVD present.  Cardiovascular: Normal rate, regular rhythm and intact distal pulses.   Respiratory: Effort normal. No respiratory distress. He has no wheezes.  Breath sounds diminished throughout.   GI: Soft. Bowel sounds are normal. He exhibits no distension. There is no tenderness.  Musculoskeletal: Normal range of motion. He exhibits no edema.  Neurological: He is alert and oriented to person, place, and time.  Skin: Skin is warm and dry. He is not diaphoretic. urinary meatus: bleeding present; the meatus has split slightly; there is pressure area present along the foley tubing skin tissue loss.  Psychiatric: He has a normal mood and affect.      ASSESSMENT/ PLAN:  1. Penile pressure ulcer: will apply polysporin oint twice daily and will monitor his status. Will use a leg bag during the day time.

## 2013-12-13 ENCOUNTER — Telehealth (HOSPITAL_COMMUNITY): Payer: Self-pay | Admitting: *Deleted

## 2013-12-17 ENCOUNTER — Encounter: Payer: Self-pay | Admitting: Internal Medicine

## 2013-12-17 ENCOUNTER — Non-Acute Institutional Stay (SKILLED_NURSING_FACILITY): Payer: Medicare Other | Admitting: Internal Medicine

## 2013-12-17 DIAGNOSIS — L22 Diaper dermatitis: Secondary | ICD-10-CM

## 2013-12-17 DIAGNOSIS — N138 Other obstructive and reflux uropathy: Secondary | ICD-10-CM

## 2013-12-17 DIAGNOSIS — N485 Ulcer of penis: Secondary | ICD-10-CM

## 2013-12-17 DIAGNOSIS — N401 Enlarged prostate with lower urinary tract symptoms: Secondary | ICD-10-CM

## 2013-12-17 DIAGNOSIS — B372 Candidiasis of skin and nail: Secondary | ICD-10-CM

## 2013-12-17 DIAGNOSIS — B3749 Other urogenital candidiasis: Secondary | ICD-10-CM

## 2013-12-17 DIAGNOSIS — N4889 Other specified disorders of penis: Secondary | ICD-10-CM

## 2013-12-17 DIAGNOSIS — N139 Obstructive and reflux uropathy, unspecified: Secondary | ICD-10-CM

## 2013-12-17 NOTE — Progress Notes (Signed)
Patient ID: [name], male   DOB: 1929/06/23, 80 y.o.   MRN: [id number]    [facility]  Chief Complaint  Patient presents with  . Acute Visit    ulcer in penile area    HPI Patient seen today for ulcer in his penile area. He has chronic indwelling foley for BPH and is on medications. He complaints of discomfort in the area. Polysporin has been tried in the area with leg bag. Wound nurse mentions he has been having bleeding in the meatus and brownish discharge. Remains afebrile. appettite is good     Of note- patient has history of lung cancer with mets to the liver and is on chemotherapy for this. He follows with oncology   Review of Systems:  Constitutional: Negative for fever, chills, weight loss, malaise/fatigue and diaphoresis.  Respiratory: Negative for cough, sputum production, shortness of breath and wheezing.   Cardiovascular: Negative for chest pain, palpitations, orthopnea and leg swelling.  Gastrointestinal: Negative for heartburn, nausea, vomiting, abdominal pain, diarrhea and constipation.  Genitourinary: has indwelling foley Musculoskeletal: Negative for falls, joint pain and myalgias. has back and foot pain Skin: Negative for itching and rash.   Past Medical History  Diagnosis Date  . Hypertension   . PVD (peripheral vascular disease)     06/27/06 widley patent subclavian vertebrl arteries. right CIA stent  . CAD (coronary artery disease)   . S/P CABG x 3 1997  . Iliac aneurysm     doppler 07/27/12: 3.1x3.0cm  . Cancer     Lung & liver- Dr. Kallie Edward  . Malignant neoplasm of pharynx, unspecified   . Other and unspecified hyperlipidemia   . Carotid artery disease     sstatus post remote left carotid endarterectomy  . Hyperlipidemia   . Atrial fibrillation with RVR 11/16/2013  . Essential hypertension, benign 01/29/2013  . Iliac artery aneurysm 01/29/2013  . Cancer of lung 01/29/2013  . Obstructive pneumonia 11/16/2013  . Syncope   . Peripheral  neuropathy   . Chronic back pain    Medication reviewed. See [facility]  Physical exam Vital signs reviewed. Stable and afebrile  Constitutional: He is oriented to person, place, and time. No distress. frail  Neck: Neck supple. No JVD present.  Cardiovascular: Normal rate, regular rhythm and intact distal pulses.   Respiratory: Effort normal. No respiratory distress. He has no wheezes.  Breath sounds diminished throughout.   GI: Soft. Bowel sounds are normal. He exhibits no distension. There is no tenderness.  Musculoskeletal: Normal range of motion. He exhibits no edema.  Neurological: He is alert and oriented to person, place, and time.  Skin: Skin is warm and dry. He is not diaphoretic. Has ulcer in the glans penis. Slight split of the urethral meatus noted and urine draining out - spurted out (from around the foley). Blood and purulent drainage noted. Red erythematous rash in the groin and around scrotum Psychiatric: He has a normal mood and affect.    ASSESSMENT/ PLAN:  Penile ulcer- possible pressure ulcer from foley tube on the glans with infection- both bacterial and fungal. Will have him started on diflucan 150 mg po x 1 and bactrim ds 1 tab bid for a week for now. Will change his foley with concern forleak. Will have a foley in place to help with healing of the ulcer and prevent contamination. Will need the ulcer area cleaned and dressing applied to prevent pressure. Will also get urology referral to assess for need of suprapubic  catheter to help with healing of this ulcer  BPH with urinary retention- foley in place. Change this and continue foley care. cotninue flomax and proscar

## 2013-12-18 ENCOUNTER — Non-Acute Institutional Stay (SKILLED_NURSING_FACILITY): Payer: Medicare Other | Admitting: Adult Health

## 2013-12-18 ENCOUNTER — Encounter: Payer: Self-pay | Admitting: Adult Health

## 2013-12-18 ENCOUNTER — Other Ambulatory Visit: Payer: Self-pay

## 2013-12-18 DIAGNOSIS — J189 Pneumonia, unspecified organism: Secondary | ICD-10-CM

## 2013-12-18 DIAGNOSIS — J90 Pleural effusion, not elsewhere classified: Secondary | ICD-10-CM

## 2013-12-18 DIAGNOSIS — N390 Urinary tract infection, site not specified: Secondary | ICD-10-CM

## 2013-12-18 LAB — CBC WITH DIFFERENTIAL/PLATELET
BASOS ABS: 0 10*3/uL (ref 0.0–0.1)
Basophil %: 0.4 %
EOS ABS: 0 10*3/uL (ref 0.0–0.7)
Eosinophil %: 0.3 %
HCT: 25.6 % — ABNORMAL LOW (ref 40.0–52.0)
HGB: 8.1 g/dL — AB (ref 13.0–18.0)
Lymphocyte #: 0.8 10*3/uL — ABNORMAL LOW (ref 1.0–3.6)
Lymphocyte %: 6.3 %
MCH: 24.5 pg — AB (ref 26.0–34.0)
MCHC: 31.7 g/dL — AB (ref 32.0–36.0)
MCV: 77 fL — ABNORMAL LOW (ref 80–100)
MONOS PCT: 7.9 %
Monocyte #: 1 x10 3/mm (ref 0.2–1.0)
Neutrophil #: 10.6 10*3/uL — ABNORMAL HIGH (ref 1.4–6.5)
Neutrophil %: 85.1 %
Platelet: 255 10*3/uL (ref 150–440)
RBC: 3.32 10*6/uL — ABNORMAL LOW (ref 4.40–5.90)
RDW: 21 % — AB (ref 11.5–14.5)
WBC: 12.4 10*3/uL — AB (ref 3.8–10.6)

## 2013-12-18 LAB — BASIC METABOLIC PANEL
Anion Gap: 6 — ABNORMAL LOW (ref 7–16)
BUN: 15 mg/dL (ref 7–18)
CREATININE: 0.58 mg/dL — AB (ref 0.60–1.30)
Calcium, Total: 8.1 mg/dL — ABNORMAL LOW (ref 8.5–10.1)
Chloride: 95 mmol/L — ABNORMAL LOW (ref 98–107)
Co2: 29 mmol/L (ref 21–32)
EGFR (Non-African Amer.): >60
Glucose: 109 mg/dL — ABNORMAL HIGH (ref 65–99)
OSMOLALITY: 262 (ref 275–301)
POTASSIUM: 4.4 mmol/L (ref 3.5–5.1)
Sodium: 130 mmol/L — ABNORMAL LOW (ref 136–145)

## 2013-12-18 NOTE — Progress Notes (Signed)
Patient ID: [name], male   DOB: 1929/03/31, 80 y.o.   MRN: [id number]     [name]  Allergies  Allergen Reactions  . Penicillins Anaphylaxis     Chief Complaint  Patient presents with  . Acute Visit    respiratory distress    HPI:  He is in respiratory distress; he has wheezes rhonchi and rales present throughout lung field. He is unable to participate in the hpi or ros today except to day that he does not want to go to the hospital. I have spoken with his wife and she is ok with this if possible. I told them I would do my best to keep him out of the hospital but would not promise. His x-ray has demonstrated right pleural effusion; chf and pneumonia his urin culture is positive as well.    Past Medical History  Diagnosis Date  . Hypertension   . PVD (peripheral vascular disease)     06/27/06 widley patent subclavian vertebrl arteries. right CIA stent  . CAD (coronary artery disease)   . S/P CABG x 3 1997  . Iliac aneurysm     doppler 07/27/12: 3.1x3.0cm  . Cancer     Lung & liver- Dr. Kallie Edward  . Malignant neoplasm of pharynx, unspecified   . Other and unspecified hyperlipidemia   . Carotid artery disease     sstatus post remote left carotid endarterectomy  . Hyperlipidemia   . Atrial fibrillation with RVR 11/16/2013  . Essential hypertension, benign 01/29/2013  . Iliac artery aneurysm 01/29/2013  . Cancer of lung 01/29/2013  . Obstructive pneumonia 11/16/2013  . Syncope   . Peripheral neuropathy   . Chronic back pain     Past Surgical History  Procedure Laterality Date  . Coronary artery bypass graft  1997  . Carotid endarterectomy      Left  . Cervical discectomy    . Lumbar disc surgery      VITAL SIGNS BP 140/67  Pulse 112  Resp 24  Ht 5\' 11"  (1.803 m)  Wt 163 lb 6.4 oz (74.118 kg)  BMI 22.80 kg/m2   Patient's Medications  New Prescriptions   No medications on file  Previous Medications   CILOSTAZOL (PLETAL) 50 MG TABLET    Take 50 mg  by mouth 2 (two) times daily.   DICLOFENAC SODIUM (VOLTAREN) 1 % GEL    Apply 2 g topically 4 (four) times daily as needed.   DILTIAZEM (CARDIZEM CD) 360 MG 24 HR CAPSULE    Take 1 capsule (360 mg total) by mouth daily.   DOCUSATE SODIUM (COLACE) 100 MG CAPSULE    Take 100 mg by mouth 2 (two) times daily as needed for mild constipation.   FINASTERIDE (PROSCAR) 5 MG TABLET    Take 5 mg by mouth daily.   FLUOXETINE (PROZAC) 40 MG CAPSULE    Take 40 mg by mouth daily.   GABAPENTIN (NEURONTIN) 400 MG CAPSULE    Take 300 mg by mouth 3 (three) times daily.    OMEPRAZOLE (PRILOSEC) 20 MG CAPSULE    Take 20 mg by mouth daily.   ONDANSETRON (ZOFRAN) 4 MG TABLET    Take 4 mg by mouth every 8 (eight) hours as needed for nausea or vomiting.   OXYCODONE (OXY IR/ROXICODONE) 5 MG IMMEDIATE RELEASE TABLET    Take 5 mg by mouth every 4 (four) hours as needed for severe pain.   POLYETHYLENE GLYCOL (MIRALAX / GLYCOLAX) PACKET    Take  17 g by mouth daily as needed.   TAMSULOSIN (FLOMAX) 0.4 MG CAPS CAPSULE    Take 0.4 mg by mouth daily.  Modified Medications   No medications on file  Discontinued Medications   No medications on file    SIGNIFICANT DIAGNOSTIC EXAMS   11-21-13: chest x-ray: 1. Moderate underlying right pleural effusion and associated lower lobe opacity likely reflecting a combination of the known malignancy with superimposed atelectasis and or infiltrate. 2. Stable mild cardiomegaly and mild vascular congestion.   11-21-13: ct of head: moderate diffuse atrophy no mass hemorrhage extra-axial fluid collection or mid line shift. There is evidence of prior infarct int he anterior left parietal lobe stable. There is small vessel disease throughout.   11-21-13: ct ofcservical spine: extensive spondylosis and arthritic changes.    12-18-13: chest x-ray: 1. Mild cardiomegaly with moderate elevated pulmonary vasculature and right pleural effusion consistent with chf. 2. Fine interstitial changes both mid  and lower lungs consistent with pneumonia or interstitial edema. 3. Moderate right pleural effusion obscuring right hemidiaphragm.     LABS REVIEWED:   11-23-13: wbc 6.7 ;hgb 8.0; hct 24.5; mcv 80; plt 357;glucose 82; bun 6; creat 0.59; k+3.8; na++137 Urine culture: staphylococcus coagulase neg.  12-18-13: wbc 12.4; hgb 8.1; hct 25.6; mcv 77; plt 255; glucose 109; bun 15; creat 0.58; k+4.4;na++130; ca++ 8.1;  Urine culture: staphylococcus aureus: bactrim    Review of Systems  Unable to perform ROS   Physical Exam  Constitutional: He appears distressed.  Neck: Neck supple. No JVD present.  Cardiovascular: Regular rhythm and intact distal pulses.   Is tachycardic  Respiratory: He is in respiratory distress.  Increased effort; with wheezes present with rales and rhonchi present throughout.   GI: Soft. Bowel sounds are normal. He exhibits no distension. There is no tenderness.  Musculoskeletal: He exhibits no edema.  Is able to move upper extremities; did not move lower extremities.   Neurological: He is alert.  Skin: Skin is warm and dry. He is not diaphoretic.  Has deep tissue injury to both feet without open areas present. No signs of infection present  Urinary meatus remains inflamed     ASSESSMENT/ PLAN:  1. Pneumonia; uti; right pleural effusion and possible CHF: will give lasix 40 mg im now then being 40 mg daily will begin k+ 20 meq daily; will continue bactrim for uti; will begin avelox 400 mg daily for 2 weeks. Will give solumedrol 60 mg im now; on 12-20-13 will repeat bmp; bnp; and chest x-ray; will continue to monitor his status.   Time spent with patient 45 minutes.

## 2013-12-21 ENCOUNTER — Non-Acute Institutional Stay (SKILLED_NURSING_FACILITY): Payer: Medicare Other | Admitting: Adult Health

## 2013-12-21 DIAGNOSIS — J189 Pneumonia, unspecified organism: Secondary | ICD-10-CM

## 2013-12-21 DIAGNOSIS — J90 Pleural effusion, not elsewhere classified: Secondary | ICD-10-CM

## 2013-12-21 DIAGNOSIS — C3490 Malignant neoplasm of unspecified part of unspecified bronchus or lung: Secondary | ICD-10-CM

## 2013-12-22 ENCOUNTER — Encounter: Payer: Self-pay | Admitting: Adult Health

## 2013-12-22 NOTE — Progress Notes (Signed)
Patient ID: [name], male   DOB: [birth year], 80 y.o.   MRN: [id number]     [facility]  Allergies  Allergen Reactions  . Penicillins Anaphylaxis     Chief Complaint  Patient presents with  . Acute Visit    patient status     HPI:  His status is not improving. He continues to have wheezes throughout his lung field; within increased respiratory effort. His skin is breaking down. I have spoken with his wife; at this time we will put off his urology appointment to discuss possible s/p foley placement. She is wanting a palliative care consult at this time. She does understand that he is not getting better as well.   Past Medical History  Diagnosis Date  . Hypertension   . PVD (peripheral vascular disease)     06/27/06 widley patent subclavian vertebrl arteries. right CIA stent  . CAD (coronary artery disease)   . S/P CABG x 3 1997  . Iliac aneurysm     doppler 07/27/12: 3.1x3.0cm  . Cancer     Lung & liver- Dr. Kallie Edward  . Malignant neoplasm of pharynx, unspecified   . Other and unspecified hyperlipidemia   . Carotid artery disease     sstatus post remote left carotid endarterectomy  . Hyperlipidemia   . Atrial fibrillation with RVR 11/16/2013  . Essential hypertension, benign 01/29/2013  . Iliac artery aneurysm 01/29/2013  . Cancer of lung 01/29/2013  . Obstructive pneumonia 11/16/2013  . Syncope   . Peripheral neuropathy   . Chronic back pain     Past Surgical History  Procedure Laterality Date  . Coronary artery bypass graft  1997  . Carotid endarterectomy      Left  . Cervical discectomy    . Lumbar disc surgery      VITAL SIGNS BP 121/69  Pulse 116  Ht 5\' 11"  (1.803 m)  Wt 163 lb 6.4 oz (74.118 kg)  BMI 22.80 kg/m2   Patient's Medications  New Prescriptions   No medications on file  Previous Medications   CILOSTAZOL (PLETAL) 50 MG TABLET    Take 50 mg by mouth 2 (two) times daily.   DICLOFENAC SODIUM (VOLTAREN) 1 % GEL    Apply 2 g topically 4  (four) times daily as needed.   DILTIAZEM (CARDIZEM CD) 360 MG 24 HR CAPSULE    Take 1 capsule (360 mg total) by mouth daily.   DOCUSATE SODIUM (COLACE) 100 MG CAPSULE    Take 100 mg by mouth 2 (two) times daily as needed for mild constipation.   FINASTERIDE (PROSCAR) 5 MG TABLET    Take 5 mg by mouth daily.   FLUOXETINE (PROZAC) 40 MG CAPSULE    Take 40 mg by mouth daily.   GABAPENTIN (NEURONTIN) 400 MG CAPSULE    Take 300 mg by mouth 3 (three) times daily.    OMEPRAZOLE (PRILOSEC) 20 MG CAPSULE    Take 20 mg by mouth daily.   ONDANSETRON (ZOFRAN) 4 MG TABLET    Take 4 mg by mouth every 8 (eight) hours as needed for nausea or vomiting.   OXYCODONE (OXY IR/ROXICODONE) 5 MG IMMEDIATE RELEASE TABLET    Take 5 mg by mouth every 4 (four) hours as needed for severe pain.   POLYETHYLENE GLYCOL (MIRALAX / GLYCOLAX) PACKET    Take 17 g by mouth daily as needed.   TAMSULOSIN (FLOMAX) 0.4 MG CAPS CAPSULE    Take 0.4 mg by mouth daily.  Modified  Medications   No medications on file  Discontinued Medications   No medications on file    SIGNIFICANT DIAGNOSTIC EXAMS   11-21-13: chest x-ray: 1. Moderate underlying right pleural effusion and associated lower lobe opacity likely reflecting a combination of the known malignancy with superimposed atelectasis and or infiltrate. 2. Stable mild cardiomegaly and mild vascular congestion.   11-21-13: ct of head: moderate diffuse atrophy no mass hemorrhage extra-axial fluid collection or mid line shift. There is evidence of prior infarct int he anterior left parietal lobe stable. There is small vessel disease throughout.   11-21-13: ct ofcservical spine: extensive spondylosis and arthritic changes.    12-18-13: chest x-ray: 1. Mild cardiomegaly with moderate elevated pulmonary vasculature and right pleural effusion consistent with chf. 2. Fine interstitial changes both mid and lower lungs consistent with pneumonia or interstitial edema. 3. Moderate right pleural  effusion obscuring right hemidiaphragm.   12-20-13: chest x-ray: 1. Mild cardiomegaly slightly increased with mild pulmonary vascular congestion without change. 2. Small moderate sized right pleural effusion without change.  3. Patchy pneumonitis  Or edema at mid/lower lungs bilaterally appears worse.    LABS REVIEWED:   11-23-13: wbc 6.7 ;hgb 8.0; hct 24.5; mcv 80; plt 357;glucose 82; bun 6; creat 0.59; k+3.8; na++137 Urine culture: staphylococcus coagulase neg.  12-18-13: wbc 12.4; hgb 8.1; hct 25.6; mcv 77; plt 255; glucose 109; bun 15; creat 0.58; k+4.4;na++130; ca++ 8.1;  Urine culture: staphylococcus aureus: bactrim 12-20-13: glucose 85; bun 23; creat 0.7; k+5.0; na++138     Review of Systems  Unable to perform ROS   Physical Exam  Constitutional: He appears distressed.  Neck: Neck supple. No JVD present.  Cardiovascular: Regular rhythm and intact distal pulses.   Is tachycardic  Respiratory: He is in respiratory distress.  Increased effort; with wheezes present  GI: Soft. Bowel sounds are normal. He exhibits no distension. There is no tenderness.  Musculoskeletal: He exhibits no edema.  Is able to move upper extremities; did not move lower extremities. Lower extremities with contractures present.   Neurological: He is alert.  Skin: Skin is warm and dry. He is not diaphoretic.  Has deep tissue injury to both feet without open areas present. No signs of infection present Sacral area stage IV with slough present there is mild inflammation present. Being treated with santyl daily. Penile meatus is slightly improved.      ASSESSMENT/ PLAN:  1.  Lung cancer with pneumonia: will have him complete his abt; will begin prednisone 40 mg daily for 3 days then decrease by 5 mg daily until taper complete.   2. Pleural effusion: will increase his lasix to 60 mg daily and will monitor  On Monday will get a chest x-ray; cbc; bmp and will setup a palliative care consult. His prognosis is  not good at this time. Will monitor his status.   Time spent with patient 45 minutes.

## 2013-12-23 LAB — CULTURE, BLOOD (SINGLE)

## 2013-12-24 ENCOUNTER — Non-Acute Institutional Stay (SKILLED_NURSING_FACILITY): Payer: Medicare Other | Admitting: Adult Health

## 2013-12-24 ENCOUNTER — Encounter: Payer: Self-pay | Admitting: Adult Health

## 2013-12-24 DIAGNOSIS — G609 Hereditary and idiopathic neuropathy, unspecified: Secondary | ICD-10-CM

## 2013-12-24 DIAGNOSIS — C3490 Malignant neoplasm of unspecified part of unspecified bronchus or lung: Secondary | ICD-10-CM

## 2013-12-24 DIAGNOSIS — G8929 Other chronic pain: Secondary | ICD-10-CM

## 2013-12-24 DIAGNOSIS — G629 Polyneuropathy, unspecified: Secondary | ICD-10-CM

## 2013-12-24 DIAGNOSIS — M549 Dorsalgia, unspecified: Secondary | ICD-10-CM

## 2013-12-24 DIAGNOSIS — C229 Malignant neoplasm of liver, not specified as primary or secondary: Secondary | ICD-10-CM

## 2013-12-24 MED ORDER — OXYCODONE HCL 5 MG PO TABS: 5.0000 mg | ORAL_TABLET | Freq: Four times a day (QID) | ORAL | Status: AC

## 2013-12-24 NOTE — Progress Notes (Signed)
Patient ID: [name], male   DOB: [birth year], 80 y.o.   MRN: [id number]     [facility]  Allergies  Allergen Reactions  . Penicillins Anaphylaxis     Chief Complaint  Patient presents with  . Acute Visit    follow up status change     HPI:  He is more alert today. There are no signs of respiratory of distress present. His chest x-ray show signs of slight improvement. He is complaining of bilateral  lower extremity pain. The increased neurontin dose has not helped relieve his pain. He has increased atrophy present in his lower extremities. I have spent time speaking with family regarding his overall status; he is declining.   Past Medical History  Diagnosis Date  . Hypertension   . PVD (peripheral vascular disease)     06/27/06 widley patent subclavian vertebrl arteries. right CIA stent  . CAD (coronary artery disease)   . S/P CABG x 3 1997  . Iliac aneurysm     doppler 07/27/12: 3.1x3.0cm  . Cancer     Lung & liver- Dr. Kallie Edward  . Malignant neoplasm of pharynx, unspecified   . Other and unspecified hyperlipidemia   . Carotid artery disease     sstatus post remote left carotid endarterectomy  . Hyperlipidemia   . Atrial fibrillation with RVR 11/16/2013  . Essential hypertension, benign 01/29/2013  . Iliac artery aneurysm 01/29/2013  . Cancer of lung 01/29/2013  . Obstructive pneumonia 11/16/2013  . Syncope   . Peripheral neuropathy   . Chronic back pain     Past Surgical History  Procedure Laterality Date  . Coronary artery bypass graft  1997  . Carotid endarterectomy      Left  . Cervical discectomy    . Lumbar disc surgery      VITAL SIGNS BP 118/68  Pulse 76  Ht 5\' 11"  (1.803 m)  Wt 161 lb 9.6 oz (73.301 kg)  BMI 22.55 kg/m2   Patient's Medications  New Prescriptions   No medications on file  Previous Medications   CILOSTAZOL (PLETAL) 50 MG TABLET    Take 50 mg by mouth 2 (two) times daily.   DICLOFENAC SODIUM (VOLTAREN) 1 % GEL    Apply 2 g  topically 4 (four) times daily as needed.   DILTIAZEM (CARDIZEM CD) 360 MG 24 HR CAPSULE    Take 1 capsule (360 mg total) by mouth daily.   DOCUSATE SODIUM (COLACE) 100 MG CAPSULE    Take 100 mg by mouth 2 (two) times daily as needed for mild constipation.   FINASTERIDE (PROSCAR) 5 MG TABLET    Take 5 mg by mouth daily.   FLUOXETINE (PROZAC) 40 MG CAPSULE    Take 40 mg by mouth daily.   GABAPENTIN (NEURONTIN) 400 MG CAPSULE    Take 300 mg by mouth 4 (four) times daily.    OMEPRAZOLE (PRILOSEC) 20 MG CAPSULE    Take 20 mg by mouth daily.   ONDANSETRON (ZOFRAN) 4 MG TABLET    Take 4 mg by mouth every 8 (eight) hours as needed for nausea or vomiting.   OXYCODONE (OXY IR/ROXICODONE) 5 MG IMMEDIATE RELEASE TABLET    Take 5 mg by mouth every 4 (four) hours as needed for severe pain.   POLYETHYLENE GLYCOL (MIRALAX / GLYCOLAX) PACKET    Take 17 g by mouth daily as needed.   TAMSULOSIN (FLOMAX) 0.4 MG CAPS CAPSULE    Take 0.4 mg by mouth daily.  Modified  Medications   No medications on file  Discontinued Medications   No medications on file    SIGNIFICANT DIAGNOSTIC EXAMS   11-21-13: chest x-ray: 1. Moderate underlying right pleural effusion and associated lower lobe opacity likely reflecting a combination of the known malignancy with superimposed atelectasis and or infiltrate. 2. Stable mild cardiomegaly and mild vascular congestion.   11-21-13: ct of head: moderate diffuse atrophy no mass hemorrhage extra-axial fluid collection or mid line shift. There is evidence of prior infarct int he anterior left parietal lobe stable. There is small vessel disease throughout.   11-21-13: ct ofcservical spine: extensive spondylosis and arthritic changes.    12-18-13: chest x-ray: 1. Mild cardiomegaly with moderate elevated pulmonary vasculature and right pleural effusion consistent with chf. 2. Fine interstitial changes both mid and lower lungs consistent with pneumonia or interstitial edema. 3. Moderate right  pleural effusion obscuring right hemidiaphragm.   12-20-13: chest x-ray: 1. Mild cardiomegaly slightly increased with mild pulmonary vascular congestion without change. 2. Small moderate sized right pleural effusion without change.  3. Patchy pneumonitis  Or edema at mid/lower lungs bilaterally appears worse.   12-24-13: chest x-ray: mild cardiomegaly lightly improved with mild pulmonary vascular congestion improved. Small right pleural effusion improved. Patchy pneumonitis or edema mid/lower lungs bilaterally improved.    LABS REVIEWED:   11-23-13: wbc 6.7 ;hgb 8.0; hct 24.5; mcv 80; plt 357;glucose 82; bun 6; creat 0.59; k+3.8; na++137 Urine culture: staphylococcus coagulase neg.  12-18-13: wbc 12.4; hgb 8.1; hct 25.6; mcv 77; plt 255; glucose 109; bun 15; creat 0.58; k+4.4;na++130; ca++ 8.1;  Urine culture: staphylococcus aureus: bactrim 12-20-13: glucose 85; bun 23; creat 0.7; k+5.0; na++138     Review of Systems  Unable to perform ROS   Physical Exam  Constitutional: He appears distressed.  Neck: Neck supple. No JVD present.  Cardiovascular: Regular rhythm and intact distal pulses.   Is tachycardic  Respiratory: No respiratory distress.  few fine wheezes present  GI: Soft. Bowel sounds are normal. He exhibits no distension. There is no tenderness.  Musculoskeletal: He exhibits no edema.  Is able to move upper extremities; did not move lower extremities. Lower extremities with contractures present. Has atrophy present in lower extremities; right >left  The patient left the office before the visit was finished.    Neurological: He is alert.  Skin: Skin is warm and dry. He is not diaphoretic.  Has deep tissue injury to both feet without open areas present. No signs of infection present Sacral area stage IV with slough present there is mild inflammation present. Being treated with santyl daily. Penile meatus is slightly improved.        ASSESSMENT/ PLAN:  1. Lung cancer;  liver cancer; chronic back pain; peripheral neuropathy: will change his oxycodone to 5 mg every 6 hours routinely and every 3 hours as needed and will monitor his status.

## 2014-01-06 DEATH — deceased

## 2014-02-18 IMAGING — CT CT CERVICAL SPINE WITHOUT CONTRAST
3 series · 15 of 27 positions shown, 18 images · non-contrast
Comparison: Brain MRI June 27, 2013

CLINICAL DATA: Pain post trauma; lung carcinoma

EXAM:
CT HEAD WITHOUT CONTRAST
CT CERVICAL SPINE WITHOUT CONTRAST
TECHNIQUE: Multidetector CT imaging of the head and cervical spine was
performed following the standard protocol without intravenous
contrast. Multiplanar CT image reconstructions of the cervical spine
were also generated.

[Series 3: c spine soft · axial · 0.38mm/px · z∈[+300,+402]mm · 4 of 87 slices shown]
[im 18/87  soft-tissue]
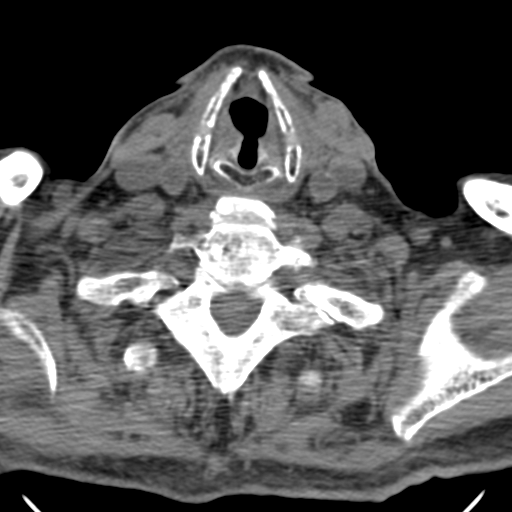
[im 35/87  soft-tissue]
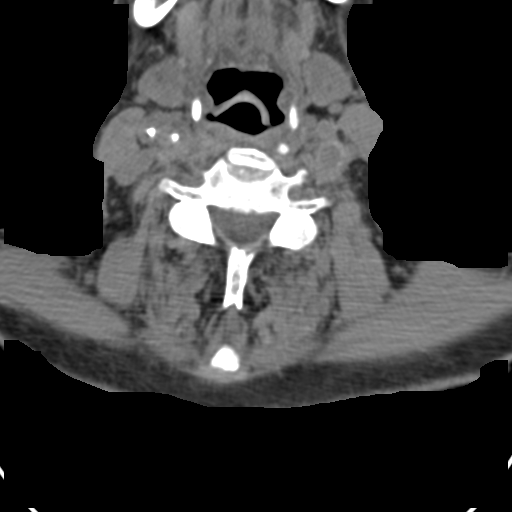
[im 52/87  soft-tissue]
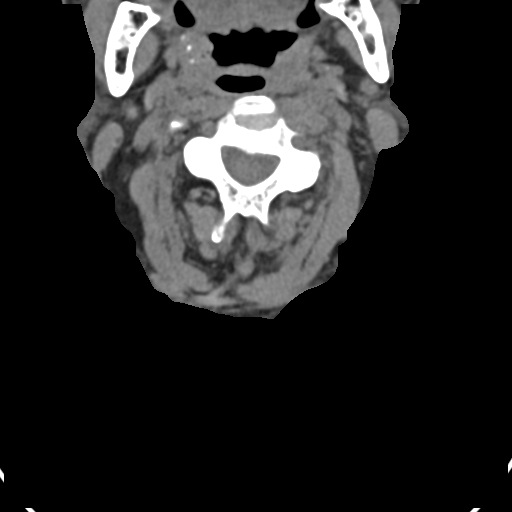
[im 69/87  soft-tissue]
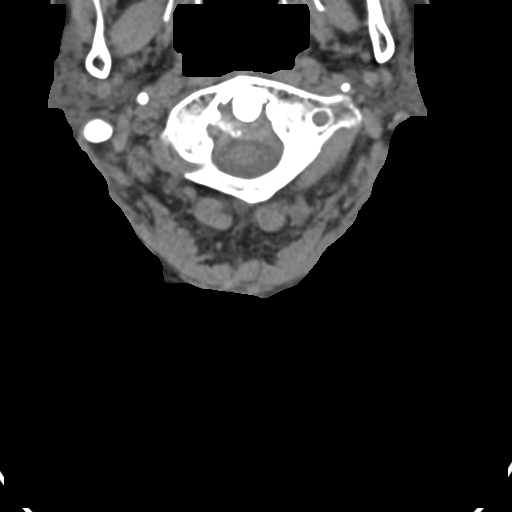

[Series 4: sag bone · sagittal · 0.43mm/px · 5 of 49 slices shown, 6 images]
[im 17/49  bone]
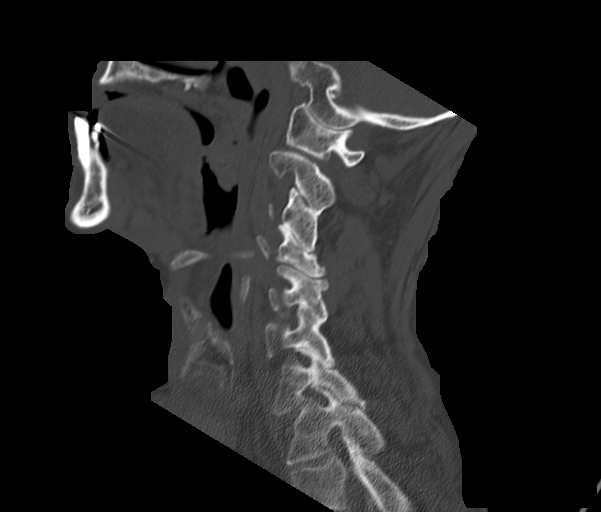
[im 21/49  bone]
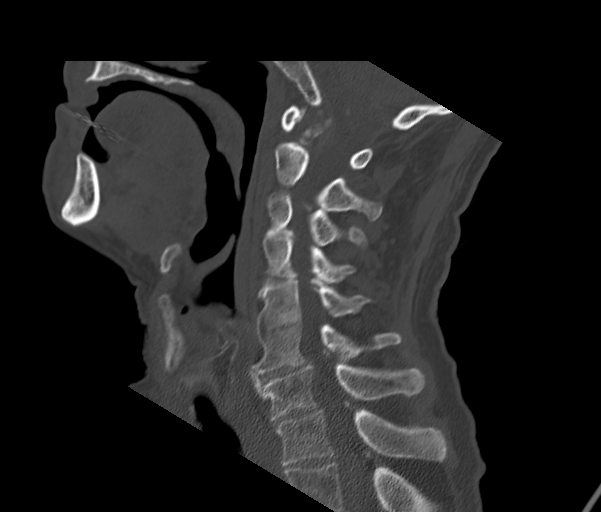
[im 25/49  soft-tissue]
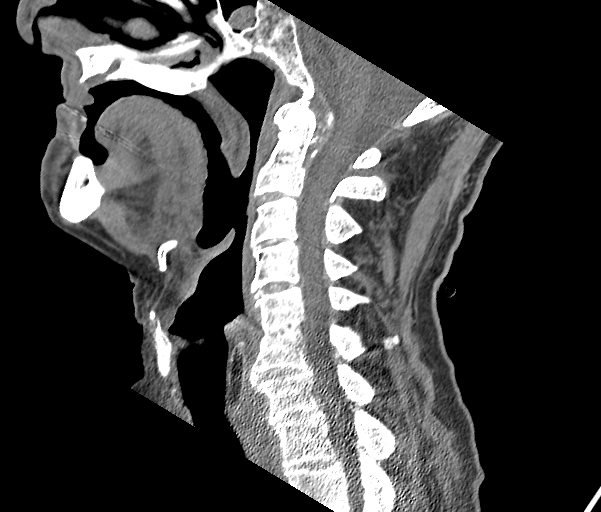
[im 25/49  bone]
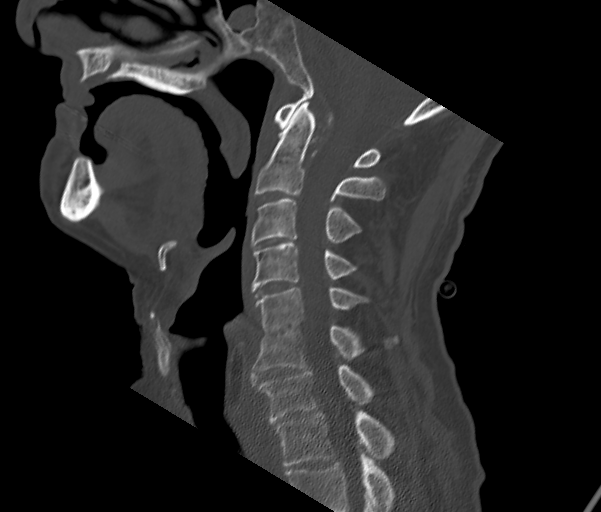
[im 29/49  bone]
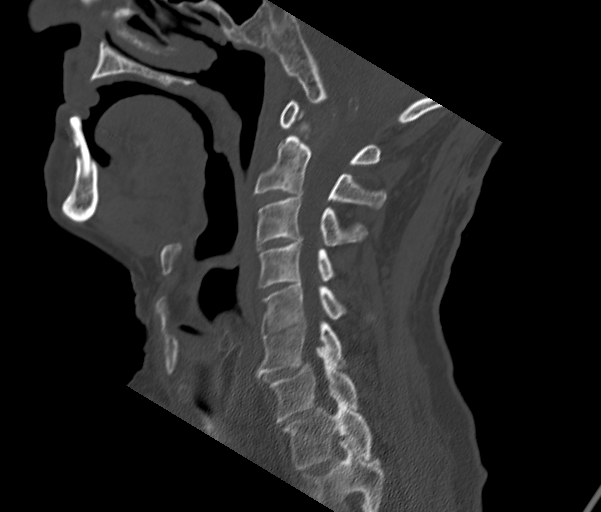
[im 33/49  bone]
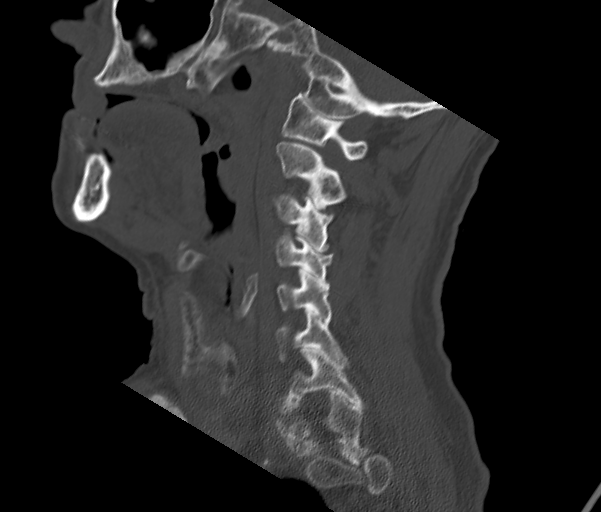

[Series 6: orthogonal axials · axial · 0.29mm/px · z∈[+227,+379]mm · 6 of 124 slices shown, 8 images]
[im 18/124  soft-tissue]
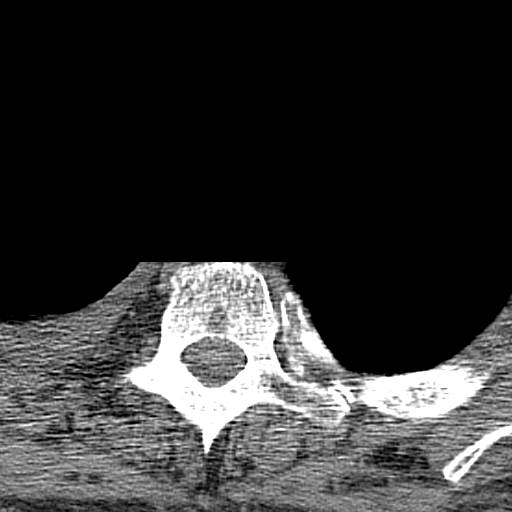
[im 18/124  bone]
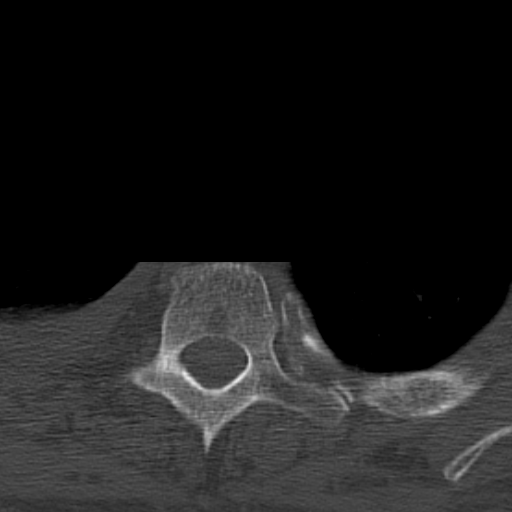
[im 36/124  bone]
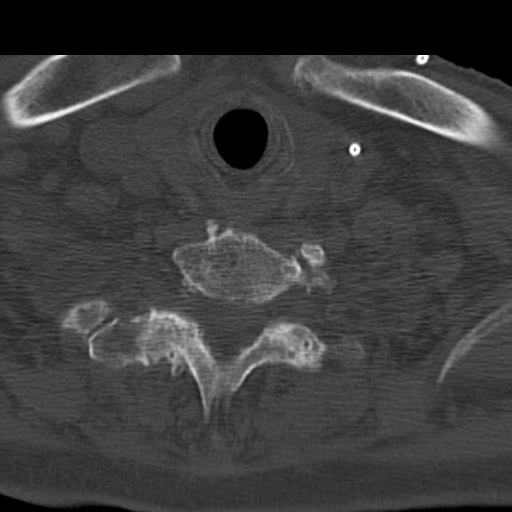
[im 53/124  bone]
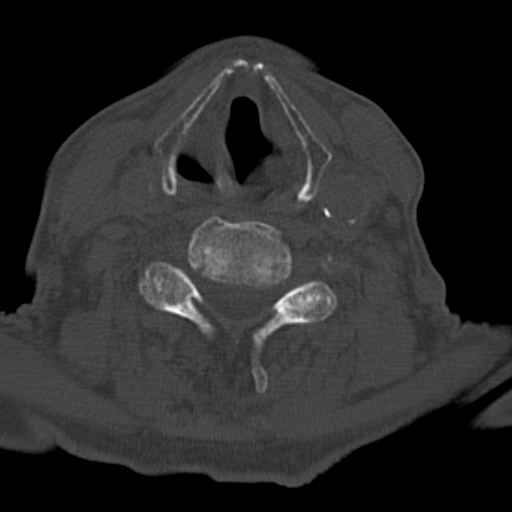
[im 71/124  bone]
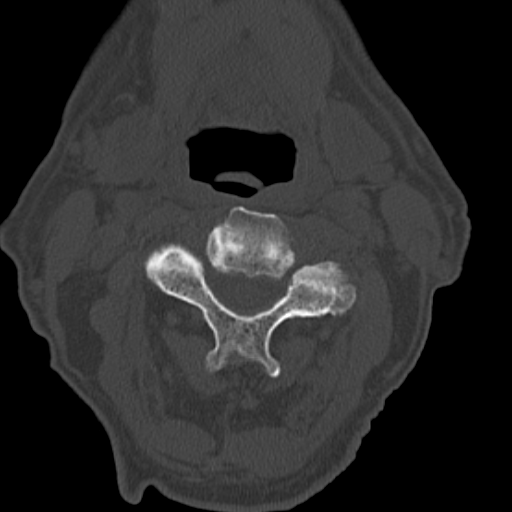
[im 88/124  soft-tissue]
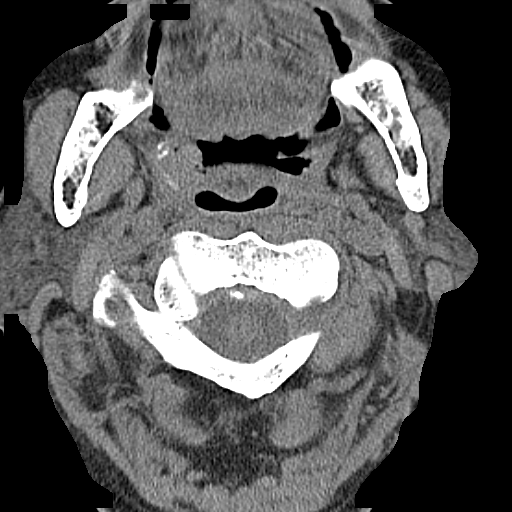
[im 88/124  bone]
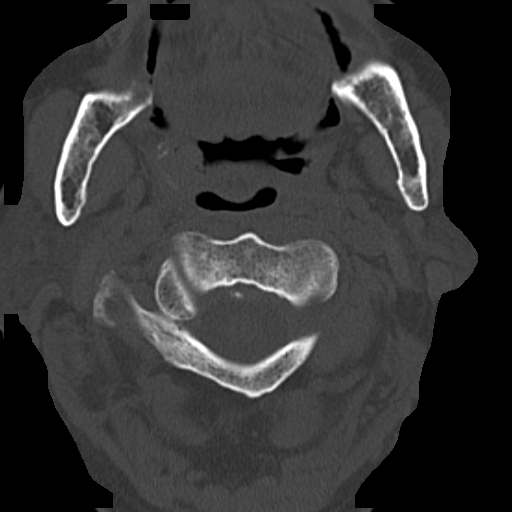
[im 106/124  bone]
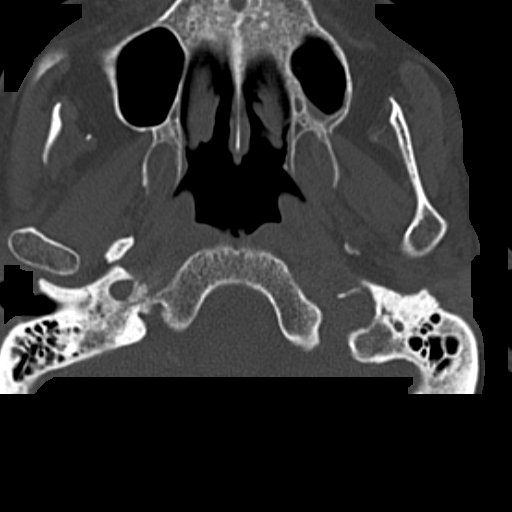

[15 of 27 positions shown; findings below may reference images not displayed]

FINDINGS: CT HEAD FINDINGS

There is moderate diffuse atrophy. There is no mass, hemorrhage,
extra-axial fluid collection, or midline shift. There is evidence of
a prior infarct in the anterior left parietal lobe, stable. There is
small vessel disease throughout the centra semiovale bilaterally,
stable. There is evidence of a prior small infarct in the mid left
frontal lobe. No acute appearing infarct is seen. There is no new
gray-white compartment lesion.

The bony calvarium appears intact. The mastoid air cells are clear.
There is paranasal sinus disease in both maxillary and sphenoid
sinus regions.

CT CERVICAL SPINE FINDINGS

There is no fracture or spondylolisthesis. Prevertebral soft tissues
and predental space regions are normal.

There is ankylosis at C5-6. There is moderately severe disc space
narrowing at C3-4 and C6-7. There is moderate disc space narrowing
at C4-5 and C7-T1. There is facet hypertrophy at essentially all
levels bilaterally. There is no frank disc extrusion or stenosis.
There is no erosive change. No blastic or lytic bone lesions are
identified. There is calcification posterior to the odontoid which
does not impress upon the craniocervical junction. Question pannus
in this area. Bones are osteoporotic.
IMPRESSION: CT head: Atrophy with small vessel disease and prior infarcts. There
is paranasal sinus disease at several sites. There is no
intracranial mass, hemorrhage, or acute infarct.

CT cervical spine: Extensive spondylosis and osteoarthritic change.
Apparent pannus is noted at the odontoid level posteriorly. There is
no evidence of cord compression. No disc extrusion or stenosis. No
acute fracture or spondylolisthesis. Bones are osteoporotic.

## 2015-03-28 NOTE — Op Note (Signed)
PATIENT NAME:  [name], [name] MR#:  [id number] DATE OF BIRTH:  Jun 25, 1929  DATE OF PROCEDURE:  07/10/2013  PREOPERATIVE DIAGNOSIS: Lung carcinoma.   POSTOPERATIVE DIAGNOSIS: Lung carcinoma.  PROCEDURE PERFORMED: Insertion of left IJ Infuse-a-Port with ultrasound and fluoroscopic guidance.   PROCEDURE PERFORMED BY: Katha Cabal, M.D.   SEDATION:  Versed 4 mg plus fentanyl 150 mcg administered IV. Continuous ECG, pulse oximetry and cardiopulmonary monitoring is performed throughout the entire procedure by the interventional radiology nurse. Total sedation time was 40 minutes.   ACCESS: Left IJ.   FLUOROSCOPY TIME: 0.7 minutes.   CONTRAST: None.   INDICATIONS: [name] is an 81 year old gentleman who is undergoing chemotherapy for treatment of a long carcinoma. He, therefore, requires adequate IV access and is undergoing placement of an Infuse-a-Port. The risks and benefits were reviewed. All questions answered. The patient has agreed to proceed.   DESCRIPTION OF PROCEDURE: The patient is taken to the special procedure suite, placed in the supine position with his neck extended slightly and rotated to the right. The left side is selected secondary to right-sided lung carcinoma. The left neck and chest wall are then prepped and draped in a sterile fashion. Ultrasound is placed in a sterile sleeve. Jugular vein is identified. It is echolucent and compressible indicating patency. Image is recorded for the permanent record. Lidocaine 1% is then infiltrated in the soft tissues, both at the base of the neck, as well as on the chest wall. With the ultrasound imaging of the jugular vein, Seldinger needle is inserted without difficulty. A J-wire is then advanced under fluoroscopy down into the inferior vena cava. Counterincision is made at the wire insertion site with an 11 blade scalpel, and a transverse incision is made in the chest wall 2 fingerbreadths below the clavicle. The pocket is then  fashioned using both sharp and blunt dissection. It is tested for appropriate size with the hub. Hub is then passed off the field to the back table.   Dilator and peel-away sheath is then inserted over the wire. The wire and dilator is removed, and the catheter is pulled subcutaneously from the pocket incision to the neck counterincision and advanced through the peel-away sheath. The peel-away sheath is removed. Fluoroscopy is then used to verify position of the tip of the catheter, which is then located at the atriocaval junction.   The catheter is then trimmed. Hub is connected, slipped into the pocket. It is accessed percutaneously with a Huber needle; aspirates easily and flushes well. Pocket incision is then closed in layers using interrupted 3-0 Vicryl for the subcutaneous tissues, followed by 4-0 Monocryl for the subcuticular, and then Dermabond. Neck counterincision is closed with a subcuticular of 4-0 Monocryl.   The patient tolerated the procedure well. There were no immediate complications. Sponge and needle counts were correct, and he was taken to the recovery area in excellent condition.      ____________________________ Katha Cabal, MD ggs:dmm D: 07/10/2013 17:51:01 ET T: 07/10/2013 20:56:28 ET JOB#: [id number]  cc: Katha Cabal, MD, <Dictator> Richard L. Rosanna Randy, MD Rae Halsted. Kallie Edward, MD Katha Cabal MD ELECTRONICALLY SIGNED 07/17/2013 8:48

## 2015-03-28 NOTE — Discharge Summary (Signed)
PATIENT NAME:  [name], [name] MR#:  [id number] DATE OF BIRTH:  [birth year]  DATE OF ADMISSION:  11/21/2013 DATE OF DISCHARGE:    Addendum  For detailed note, please take a look at the history and physical done on admission by Dr. Bridgette Habermann. Please take a look at the discharge summary, done by me on 11/26/2013, for the extensive hospital course.  This is just a brief addendum.   This is an 81 year old male with past medical history of lung cancer, chronic anemia, BHP, chronic atrial fibrillation, who was admitted due to syncope/fall. The patient was recently discharged from [facility] after being treated for pneumonia.  The patient has to be evaluated by physical therapy here in the hospital, thought he would benefit from being discharged to short-term rehab, which is currently where he is being discharged. We were awaiting insurance approval for him to be discharged to a skilled nursing facility. The patient has now received insurance approval, and therefore is being discharged there today. There has been no acute changes in his hospital course from yesterday to today. Please take a look at my detailed discharge summary, done on 11/26/2013, for the extensive other hospital course.     TIME SPENT: 30 minutes.   ____________________________ Belia Heman. Verdell Carmine, MD vjs:dmm D: 11/27/2013 12:03:28 ET T: 11/27/2013 12:17:32 ET JOB#: [id number]  cc: Belia Heman. Verdell Carmine, MD, <Dictator> Henreitta Leber MD ELECTRONICALLY SIGNED 11/29/2013 13:47

## 2015-03-28 NOTE — Discharge Summary (Signed)
PATIENT NAME:  [name], [name] MR#:  [id number] DATE OF BIRTH:  [birth year]  DATE OF ADMISSION:  11/21/2013 DATE OF DISCHARGE:  11/26/2013  For a detailed note, please take a look at the history and physical done on admission by Dr. Bridgette Habermann.  DISCHARGE DIAGNOSES:  1.  Syncope/fall.  2.  History of lung cancer.  3.  Pneumonia, likely postobstructive.  4.  Generalized weakness.  5.  Chronic anemia.  6.  Benign prostatic hypertrophy.  7.  Chronic atrial fibrillation.   DISCHARGE INSTRUCTIONS: The patient is being discharged home on a low-sodium diet. Activity as tolerated. Followup is with Dr. Miguel Aschoff in the next 1 to 2 weeks.   DISCHARGE MEDICATIONS:  1.  Omeprazole 20 mg daily. 2.  Gabapentin 300 mg at bedtime. 3.  Fluoxetine 40 mg daily. 4.  Flomax 0.4 mg daily. 5.  Finasteride 5 mg daily. 6.  Cilostazol 50 mg b.i.d. 7.  Zofran 4 mg q. 8 hours as needed. 8.  Cardizem CD 360 mg daily. 9.  Oxycodone 5 mg every 4 hours as needed. 10.  Levaquin 750 mg daily x7 days.   CONSULTANTS DURING HOSPITAL COURSE: Dr. Kallie Edward from oncology, Dr. Izora Gala Phifer from palliative care.   PERTINENT STUDIES DONE DURING THE HOSPITAL COURSE: A CT of the head done without contrast on admission showing atrophy with small vessel disease and prior infarcts. A   CT of the cervical spine showing extensive spondylosis and osteoarthritic change. No evidence of any acute bony abnormalities.   A chest x-ray done on admission showing moderate layering right pleural effusion with associated lower lobe opacity reflecting a combination of known malignancy and superimposed atelectasis or infiltrate.   The patient's urine cultures are growing 100,000 colonies of Coagulase-negative  Staphylococcus, which is likely a contaminant.   HOSPITAL COURSE: This is an 81 year old male with medical problems as mentioned above who presented to the hospital on 11/21/2013 due to generalized weakness and a fall.  1.   Syncope/fall. The patient apparently was at [facility] for treatment for pneumonia not too long ago, presented to the hospital after being discharged from [facility] recently. He had a fall at home. Initially it was thought to be related to a possible syncopal episode, although the patient was observed on telemetry, had no evidence of any acute arrhythmias, other than his chronic A-fib. He had troponins x3 checked, which were negative. He was not noted to be orthostatic. His CT head was negative. The patient has had no further episodes of syncope while in the hospital. The patient was evaluated by physical therapy while in the hospital and they thought he would benefit from short-term rehab, which is presently where he is being discharged.  2.  Pneumonia. This was likely a post obstructive pneumonia given his underlying malignancy. The patient has been maintained on some Levaquin. He is being discharged on 7 more days of Levaquin as stated. He will also continue some Robitussin for his cough.  3.  Chronic anemia. The patient's hemoglobin as of December 19 is 8. He has chronic anemia related to his malignancy and chemotherapy. This further needs to be followed.  4.  Lung cancer. The patient is followed by Dr. Craige Cotta from oncology. She has no plans of starting the patient back on chemotherapy given his poor performance status. The patient was seen by palliative care during the hospital course and was made a DNR.  5.  History of chronic atrial fibrillation. This apparently was recently diagnosed.  The patient has been maintained on his Cardizem for rate control and remained stable on it. He was on Xarelto prior to coming in, although he is a high risk for anticoagulation given his high fall risk; therefore, he has been taken off any anticoagulants presently.  6.  Constipation. This has since then resolved with some lactulose presently. I will be discharging him on some oral MiraLax and Colace.  7.  BPH.  The patient had no evidence of urinary obstruction. He was maintained on his finasteride and he will continue that.  8.  Depression. The patient was maintained on his fluoxetine and he will resume that upon discharge too.   The patient is a DNI/DNR.   TIME SPENT ON DISCHARGE: 45 minutes.  ____________________________ Belia Heman. Verdell Carmine, MD vjs:sb D: 11/26/2013 11:01:02 ET T: 11/26/2013 11:12:08 ET JOB#: [id number]  cc: Belia Heman. Verdell Carmine, MD, <Dictator> Richard L. Rosanna Randy, MD Henreitta Leber MD ELECTRONICALLY SIGNED 11/29/2013 13:47

## 2015-03-28 NOTE — Consult Note (Signed)
Reason for Visit: This 81 year old Male patient presents to the clinic for initial evaluation of  stage IV small cell lung cancer .   Referred by Dr. Kallie Edward.  Diagnosis:   Chief Complaint/Diagnosis   patient is a 81 year old male initially presented with weight loss cough and is thought to have presumed pneumonia. He was initially treated with antibiotics and prednisone with some improvement. CT scan performed August 22, 2012 showed an infrahilar right lower lobe mass. Also is noted to have a liver lesion. Underwent needle biopsy of his liver which was positive for small cell undifferentiated carcinoma.he was started on carboplatin and etoposide has had 2 cycles. By CT criteria his had good resolution of his lung mass as well as his liver looked metastasis with diminution in size of both areas. He is doing well otherwise. P. would take is fair. No other areas of bony pain. No change in neurologic status. Brain scan showed no evidence of disease. I been asked to evaluate the patient for whole brain radiation therapy to prevent recurrence or progression of disease in the brain.   Pathology Report pathology report reviewed    Imaging Report PET/CT scan and CT scans MRI scans reviewed    Referral Report linical notes reviewed    Planned Treatment Regimen whole brain radiation    HPI   as above  Past Hx:    vocal cord cancer:    PVD - Peripheral Vascular Disease:    Depression:    Hypotension:    pneumonia:    chronic back pain:    cad:    Neck Surgery:    Back Surgery:    cabg:   Past, Family and Social History:   Past Medical History positive    Cardiovascular CABG performed; coronary artery disease; history of hypotension, peripheral vascular disease    Respiratory pneumonia    Neurological/Psychiatric depression    Past Surgical History history of neck surgery    Past Medical History Comments chronic back painhistory of vocal cord cancer    Family History  positive    Family History Comments sister with lung cancer also family history of colon cancer    Social History positive    Social History Comments 100-pack-year smoking history quit smoking 1997    Additional Past Medical and Surgical History seen with multiple family members today   Allergies:   Penicillin: Itching, Swelling, Resp. Distress, Hives  Home Meds:  Home Medications: Medication Instructions Status  omeprazole 20 mg oral delayed release capsule 1 cap(s) orally once a day Active  ondansetron 4 mg oral tablet 1 tab(s) orally every 8 hours, As Needed- for Nausea, Vomiting  Active  niacin 1000 mg at bedtime  Active  prozac 10 mg at bedtime  Active  Soma Compound  tid  Active  valium '5mg'$  tid  Active  Neurontin 400 mg oral capsule 1 cap(s) orally 4 times a day Active  cilostazol 50 mg oral tablet 1 tab(s) orally 2 times a day Active  Amitiza 24 mcg oral capsule 1 cap(s) orally 2 times a day Active  Hydrocodone CP  orally every 6 hours, As Needed- for Pain  Active  multivitamin 1 tab(s) orally once a day Active   Review of Systems:   General negative    Performance Status (ECOG) 0    Skin negative    Breast negative    Ophthalmologic negative    ENMT negative    Respiratory and Thorax see HPI    Cardiovascular  see HPI    Gastrointestinal negative    Genitourinary negative    Musculoskeletal negative    Neurological negative    Psychiatric see HPI    Hematology/Lymphatics negative    Endocrine negative    Allergic/Immunologic negative    Review of Systems   according to nurse's notesPatient denies any weight loss, fatigue, weakness, fever, chills or night sweats. Patient denies any loss of vision, blurred vision. Patient denies any ringing  of the ears or hearing loss. No irregular heartbeat. Patient denies heart murmur or history of fainting. Patient denies any chest pain or pain radiating to her upper extremities. Patient denies any shortness of  breath, difficulty breathing at night, cough or hemoptysis. Patient denies any swelling in the lower legs. Patient denies any nausea vomiting, vomiting of blood, or coffee ground material in the vomitus. Patient denies any stomach pain. Patient states has had normal bowel movements no significant constipation or diarrhea. Patient denies any dysuria, hematuria or significant nocturia. Patient denies any problems walking, swelling in the joints or loss of balance. Patient denies any skin changes, loss of hair or loss of weight. Patient denies any excessive worrying or anxiety or significant depression. Patient denies any problems with insomnia. Patient denies excessive thirst, polyuria, polydipsia. Patient denies any swollen glands, patient denies easy bruising or easy bleeding. Patient denies any recent infections, allergies or URI. Patient "s visual fields have not changed significantly in recent time.  Nursing Notes:  Nursing Vital Signs and Chemo Nursing Nursing Notes: *CC Vital Signs Flowsheet:   10-Jan-14 09:44   Temp Temperature 97.9   Pulse Pulse 90   Respirations Respirations 20   SBP SBP 144   DBP DBP 62   Pain Scale (0-10)  5   Current Weight (kg) (kg) 75   Height (cm) centimeters 180   BSA (m2) 1.9   Physical Exam:  General/Skin/HEENT:   General normal    Skin normal    Eyes normal    ENMT normal    Head and Neck normal    Additional PE well-developed elderly male wheelchair-bound in NAD. He does ambulate although is using wheelchair for transportation today. No evidence of cervical or supraclavicular adenopathy is identified. Lungs are clear to A&P cardiac examination shows regular rate and rhythm. Abdomen is benign with no organomegaly or masses noted. Motor sensory N. DTR levels are equal and symmetric in the upper and lower extremities. Cranial nerves II through XII are grossly intact. Crude visual fields are within normal range.   Breasts/Resp/CV/GI/GU:   Respiratory  and Thorax normal    Cardiovascular normal    Gastrointestinal normal    Genitourinary normal   MS/Neuro/Psych/Lymph:   Musculoskeletal normal    Neurological normal    Lymphatics normal   Assessment and Plan:  Impression:   extensive stage small cell lung cancer with liver  81 year old male responding well to systemic chemotherapy.  Plan:   although certainly not curative approach it is become customary to treat whole brain radiation to prevent metastatic disease to the brain and patient's responding to systemic chemotherapy. To avoid cognitive function decline as much as possible would like to treat 3000 cGy in 15 fractions to his whole brain. Risks and benefits of treatment were explained to the patient. Loss apparent, skin reaction the scalp, possible cognitive decline, and alteration of blood counts were all explained in detail to the patient and his family. They all seem to comprehend my treatment plan well. I have set the patient  up for CT simulation in about a week's time.  I would like to take this opportunity to thank you for allowing me to continue to participate in this patient's care.  CC Referral:   cc: Dr. Miguel Aschoff   Electronic Signatures: Baruch Gouty Roda Shutters (MD)  (Signed 10-Jan-14 12:03)  Authored: HPI, Diagnosis, Past Hx, PFSH, Allergies, Home Meds, ROS, Nursing Notes, Physical Exam, Encounter Assessment and Plan, CC Referring Physician   Last Updated: 10-Jan-14 12:03 by Armstead Peaks (MD)

## 2015-03-28 NOTE — Consult Note (Signed)
Brief Consult Note: Diagnosis: Refractory Small cell lung cancer.   Comments: Patient has poor performance status. Initial repsonse to chemotherapy but short lived and relapsed within a few months. Has not been able to tolerate chemotherapy. No further chemotherapy planned due to performance status. Chronic history of falls partly related to mobility with his bilateral leg braces. Agree with discontinuing anticoagulation. Agree with palliative care/hospice referral.  Electronic Signatures: Georges Mouse (MD)  (Signed 18-Dec-14 15:54)  Authored: Brief Consult Note   Last Updated: 18-Dec-14 15:54 by Georges Mouse (MD)

## 2015-03-29 NOTE — H&P (Signed)
PATIENT NAME:  [name], [name] MR#:  [id number] DATE OF BIRTH:  [birth year]  DATE OF ADMISSION:  11/21/2013  REFERRING PHYSICIAN: Dr. Kerman Passey   PRIMARY CARE PHYSICIAN: Dr. Rosanna Randy  PRIMARY ONCOLOGIST:  Dr. Kallie Edward at [facility].   CHIEF COMPLAINT: Weakness.   HISTORY OF PRESENT ILLNESS: The patient is an 81 year old male with history of lung cancer who has finished radiations on chemo currently with chronic back pain, CABG and a recent afib. Of note, he was just discharged from [facility] per him yesterday during which he was found to have pneumonia and also did have afib with RVR. It appears that he is on Levaquin. He says that he went home. This morning he woke up and felt weak. He has been having weakness for the past 3 or 4 days he states. He was sitting on the commode and did have a large bowel movement and after hours felt very weak and felt like he could not get up. He did get up eventually, but did fall down. There was no loss of consciousness. He states that for the last 2 days he has also been having dark stools. Of note, he has been started on Xarelto. There is no bloody stools. He came into the hospital where he was found to have afib with RVR rate of 123. Hospitalist services were contacted for further evaluation and management.   PAST MEDICAL HISTORY: 1.  Small cell lung cancer status post radiation currently on chemo. Last was chemo was 2 weeks ago. 2.  Obstructive pneumonia on Levaquin currently.  3.  Chronic back pain.  4.  Bilateral foot drops per patient.  5.  Depression.  6.  Peripheral vascular disease.  7.  Vocal cord cancer.  8.  CAD. 9.  Neck surgery.  10.  Back surgery. 11.  Peripheral neuropathy. 12.  Weakness. 13.  Chronic anemia.  ALLERGIES:  PENICILLIN.   OUTPATIENT MEDICATIONS:  Xarelto 20 mg daily, diltiazem 360 mg extended-release 1 tab daily, Levaquin 750 mg daily, cilostazol 50 mg 2 times a day, finasteride 5 mg daily, Flomax 0.4 mg daily,  fluoxetine 40 mg daily, gabapentin 300 mg daily, omeprazole 20 mg daily, Zofran p.r.n., oxycodone p.r.n.   SOCIAL HISTORY: Ex-smoker, quit multiple years ago. No alcohol or drug use. Lives with wife.   FAMILY HISTORY: Positive. Sister with cancer in the lung. Sister with colon cancer. Brother with colon cancer.  REVIEW OF SYSTEMS:  CONSTITUTIONAL: Positive for fatigue and weakness. Also 40 pound weight loss this year.   EYES: No blurry vision or double vision.  ENT: No tinnitus or hearing loss.  RESPIRATORY: Positive for cough and whitish sputum and recent obstructive pneumonia. No painful respirations.  CARDIOVASCULAR: Denies chest pain or palpitations. No swelling in the legs. Has history of CAD. No loss of consciousness.  GASTROINTESTINAL: Denies nausea, vomiting, diarrhea or abdominal pain. Has constipation. Had black stools yesterday and today.  GENITOURINARY: Denies dysuria, hematuria. Has chronic Foley catheter for urinary retention.  ENDOCRINE: Denies polyuria or nocturia. HEMATOLOGIC AND LYMPHATIC:  Denies anemia or easy bruising.  SKIN: Denies any new rashes.  MUSCULOSKELETAL: Has chronic back pain, neck pain and leg weakness.  NEUROLOGIC: Has neuropathy.  PSYCHIATRIC: Has depression.   PHYSICAL EXAMINATION: VITAL SIGNS: Temperature on arrival 97.5, pulse rate 123 on arrival, respiratory rate 19, blood pressure 119/56, O2 sat 96% on room air. While I was in the room, heart rate was anywhere from the low 90s to low 100s. GENERAL:  The patient  is chronically ill-appearing male lying in bed, no obvious distress, talking in full sentences.  HEENT: Normocephalic, atraumatic. Pupils are equal and reactive. Dry mucous membranes. Poor dentition.  NECK: Supple. No thyroid tenderness. No cervical lymphadenopathy.  CARDIOVASCULAR: S1, S2, irregularly irregular, tachycardic.  LUNGS: Bilateral mid and lower base mild crackles and poor air entry. The patient does have a Chemo-Port in the  anterior chest.  ABDOMEN: Soft, nontender, nondistended. Positive bowel sounds in all quadrants.  EXTREMITIES: No significant lower extremity edema. The patient does have a Foley.  SKIN: No obvious rashes. NEUROLOGIC:  Strength upper extremities has 5/5 strength and sensation intact. Otherwise. cranial nerves II through XII grossly intact. Strength in the lower extremity appears to be 4 in bilateral lower extremity.  PSYCHIATRIC: Awake, alert, oriented x 3.  LABORATORIES AND IMAGING:  CT of the head: Atrophy with small vessel disease and prior infarcts. CT of the C-spine: Extensive spondylosis and osteoarthritic changes, apparent pannus is noted in the odontoid level posteriorly. There is no evidence of cord compression. No disk extrusion or stenosis. No acute fracture.  Troponin negative. White count 7.6, hemoglobin 8.4, platelets are 355, sodium 135, potassium 3.6. BUN 9, creatinine 0.69, glucose 119 and EKG is afib with RVR, rate of 124, some nonspecific ST changes. No acute ST elevations or depressions.   ASSESSMENT AND PLAN: We have an 81 year old with lung cancer on chemoradiation. Recent discharge from [facility] with obstructive pneumonia, atrial fibrillation with rapid ventricular response on diltiazem and Xarelto, who comes in with presyncopal episode, weakness and a fall. I believe the patient's weakness is likely significant deconditioning and also possibly vasovagal as he did have the fall after passing a large bowel movement. There is no loss of consciousness. When I asked the wife if this level of weakness in the lower extremities is typical for him, the wife states, "Yes, this is his norm and he can't walk much currently." He does use foot braces when he walks, she states. I believe that the patient is deconditioned secondary to recent pneumonia and atrial fibrillation with rapid ventricular response. He does not appear to have a fever or white count currently. I would continue the  Levaquin, obtain a physical therapy consult, start the patient on some gentle fluids. The patient might need some short-term rehabilitation as well for his deconditioning. I would start the patient on diltiazem 30 mg q. 6 hours as his blood pressure has been a little labile while here. Per ER staff, he did have a blood pressure which was in the 98/55 range as well. I would hold the Xarelto given the dark stools. Although he does have atrial fibrillation, which per wife, this might be in the last several weeks, I believe he is significant fall risk given his neuropathy, bilateral foot drops and overall nonetheless has poor prognosis with his cancer and I would hold his Xarelto at this point, check a stool guaiac and see how he does with physical therapy. I would start some pain medicines as well. Continue the Levaquin. I would obtain palliative care consult as well as consult the patient's primary oncologist Dr. Kallie Edward. The patient is DO NOT RESUSCITATE per his wishes.  TOTAL TIME SPENT:  50 minutes    ____________________________ Vivien Presto, MD sa:ce D: 11/21/2013 14:35:56 ET T: 11/21/2013 15:54:23 ET JOB#: [id number]  cc: Vivien Presto, MD, <Dictator> Vivien Presto MD ELECTRONICALLY SIGNED 12/18/2013 11:08
